# Patient Record
Sex: Male | Born: 1965 | Race: Black or African American | Hispanic: No | Smoking: Never smoker
Health system: Southern US, Community
[De-identification: ages and names within clinical notes are randomized; demographics above are authoritative.]

## PROBLEM LIST (undated history)

## (undated) DIAGNOSIS — E790 Hyperuricemia without signs of inflammatory arthritis and tophaceous disease: Secondary | ICD-10-CM

## (undated) DIAGNOSIS — K219 Gastro-esophageal reflux disease without esophagitis: Secondary | ICD-10-CM

## (undated) DIAGNOSIS — H3320 Serous retinal detachment, unspecified eye: Secondary | ICD-10-CM

## (undated) DIAGNOSIS — T7840XA Allergy, unspecified, initial encounter: Secondary | ICD-10-CM

## (undated) DIAGNOSIS — N529 Male erectile dysfunction, unspecified: Secondary | ICD-10-CM

## (undated) DIAGNOSIS — K469 Unspecified abdominal hernia without obstruction or gangrene: Secondary | ICD-10-CM

## (undated) DIAGNOSIS — E7800 Pure hypercholesterolemia, unspecified: Secondary | ICD-10-CM

## (undated) DIAGNOSIS — I10 Essential (primary) hypertension: Secondary | ICD-10-CM

## (undated) DIAGNOSIS — H409 Unspecified glaucoma: Secondary | ICD-10-CM

## (undated) DIAGNOSIS — I82409 Acute embolism and thrombosis of unspecified deep veins of unspecified lower extremity: Secondary | ICD-10-CM

## (undated) HISTORY — DX: Essential (primary) hypertension: I10

## (undated) HISTORY — DX: Allergy, unspecified, initial encounter: T78.40XA

## (undated) HISTORY — DX: Serous retinal detachment, unspecified eye: H33.20

## (undated) HISTORY — DX: Hyperuricemia without signs of inflammatory arthritis and tophaceous disease: E79.0

## (undated) HISTORY — PX: LACERATION REPAIR: SHX5168

## (undated) HISTORY — DX: Gastro-esophageal reflux disease without esophagitis: K21.9

## (undated) HISTORY — DX: Male erectile dysfunction, unspecified: N52.9

## (undated) HISTORY — PX: RETINAL DETACHMENT SURGERY: SHX105

---

## 2001-10-19 ENCOUNTER — Emergency Department (HOSPITAL_COMMUNITY): Admission: EM | Admit: 2001-10-19 | Discharge: 2001-10-19 | Payer: Self-pay | Admitting: Emergency Medicine

## 2001-10-19 ENCOUNTER — Encounter: Payer: Self-pay | Admitting: Emergency Medicine

## 2003-07-25 ENCOUNTER — Emergency Department (HOSPITAL_COMMUNITY): Admission: EM | Admit: 2003-07-25 | Discharge: 2003-07-25 | Payer: Self-pay | Admitting: Family Medicine

## 2003-08-19 ENCOUNTER — Emergency Department (HOSPITAL_COMMUNITY): Admission: EM | Admit: 2003-08-19 | Discharge: 2003-08-20 | Payer: Self-pay | Admitting: Emergency Medicine

## 2003-11-01 ENCOUNTER — Emergency Department (HOSPITAL_COMMUNITY): Admission: EM | Admit: 2003-11-01 | Discharge: 2003-11-01 | Payer: Self-pay | Admitting: Emergency Medicine

## 2004-02-26 ENCOUNTER — Emergency Department (HOSPITAL_COMMUNITY): Admission: EM | Admit: 2004-02-26 | Discharge: 2004-02-26 | Payer: Self-pay | Admitting: Emergency Medicine

## 2004-06-16 ENCOUNTER — Emergency Department (HOSPITAL_COMMUNITY): Admission: EM | Admit: 2004-06-16 | Discharge: 2004-06-16 | Payer: Self-pay | Admitting: Family Medicine

## 2004-08-20 ENCOUNTER — Emergency Department (HOSPITAL_COMMUNITY): Admission: EM | Admit: 2004-08-20 | Discharge: 2004-08-21 | Payer: Self-pay | Admitting: Emergency Medicine

## 2004-11-20 ENCOUNTER — Emergency Department (HOSPITAL_COMMUNITY): Admission: EM | Admit: 2004-11-20 | Discharge: 2004-11-20 | Payer: Self-pay | Admitting: Emergency Medicine

## 2005-02-25 ENCOUNTER — Emergency Department (HOSPITAL_COMMUNITY): Admission: EM | Admit: 2005-02-25 | Discharge: 2005-02-25 | Payer: Self-pay | Admitting: Emergency Medicine

## 2005-05-22 ENCOUNTER — Emergency Department (HOSPITAL_COMMUNITY): Admission: EM | Admit: 2005-05-22 | Discharge: 2005-05-23 | Payer: Self-pay | Admitting: Emergency Medicine

## 2005-07-13 ENCOUNTER — Ambulatory Visit (HOSPITAL_COMMUNITY): Admission: RE | Admit: 2005-07-13 | Discharge: 2005-07-14 | Payer: Self-pay | Admitting: Ophthalmology

## 2005-12-06 ENCOUNTER — Inpatient Hospital Stay (HOSPITAL_COMMUNITY): Admission: EM | Admit: 2005-12-06 | Discharge: 2005-12-07 | Payer: Self-pay

## 2007-03-18 ENCOUNTER — Emergency Department (HOSPITAL_COMMUNITY): Admission: EM | Admit: 2007-03-18 | Discharge: 2007-03-18 | Payer: Self-pay | Admitting: Emergency Medicine

## 2007-04-21 ENCOUNTER — Emergency Department (HOSPITAL_COMMUNITY): Admission: EM | Admit: 2007-04-21 | Discharge: 2007-04-21 | Payer: Self-pay | Admitting: Family Medicine

## 2007-08-18 ENCOUNTER — Emergency Department (HOSPITAL_COMMUNITY): Admission: EM | Admit: 2007-08-18 | Discharge: 2007-08-18 | Payer: Self-pay | Admitting: Emergency Medicine
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
## 2010-10-01 NOTE — Op Note (Signed)
NAMENIKLAS, CHRETIEN          ACCOUNT NO.:  [id number]   MEDICAL RECORD NO.:  [id number]          PATIENT TYPE:  OIB   LOCATION:  5705                         FACILITY:  [facility]   PHYSICIAN:  Alford Highland. Rankin, [name]   DATE OF BIRTH:  07-30-1965   DATE OF PROCEDURE:  07/13/2005  DATE OF DISCHARGE:                                 OPERATIVE REPORT   PREOPERATIVE DIAGNOSIS:  1.  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  2.  History of congenital cataracts, left eye.   POSTOPERATIVE DIAGNOSIS:  1.  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  2.  History of congenital cataracts, left eye.   PROCEDURE:  1.  Posterior vitrectomy with membrane peel, left eye.  2.  Endolaser panphotocoagulation, - focal left eye.  3.  Scleral buckle, left eye.  4.  Injection of vitreous substitute - C3F8 10%.  5.  Injection of vitreous substitute - temporary Perflouron.   SURGEON:  Alford Highland. Rankin, [name]   ANESTHESIA:  General endotracheal anesthesia.   INDICATIONS FOR PROCEDURE:  The patient is a 45 year old man who has a  history of congenital cataracts, congenital nystagmus, and within the last  five days, sudden  painless vision loss of the left eye.  He was found to  have rhegmatogenous retinal detachment of a highly bullous nature on the  office visit today.  The macula was found to be shallow detachment with  ultrasound left eye.  The patient understands this is an attempt to reattach  the retina.  He understands the risks of anesthesia including the rare  occurrence of death and also to the eye including but not limited to  hemorrhage, infection, scarring, need for further surgery, no change in  vision, loss of vision, progression of disease despite intervention.  After  appropriate signed consent was obtained, the patient was taken to the  operating room.   DESCRIPTION OF PROCEDURE:  In the operating room, appropriate monitoring was  followed by mild sedation.   General endotracheal anesthesia was instituted  without difficulty.  The left periocular region was sterilely prepped and  draped in the usual ophthalmic fashion.  A lid speculum was applied.  A  conjunctival peritomy was then fashioned 360 degrees and relaxing incision  was made inferiorly and superonasally.  The rectus muscles were isolated on  2-0 silk ties.  A 240 silicone implant was selected placing each of the  rectus muscles and secured in the superotemporal quadrant.  Retinal cryopexy  was applied to the bed of the buckle 360 degrees.  At this time, the  infusion was secured in the inferotemporal quadrant.  Placement in the  vitreous cavity was verified visually.  The superior sclerotomy was then  placed.  The microscope was placed into position with a BIOM attachment.  Core vitrectomy was then begun.  Notable findings were a retinal tear noted  at the 9 o'clock position as well as at the 9:30 positions.  These were  marked with internal diathermy for later external drainage of subretinal  fluid.  Attempts were made not to place a posterior retinotomy.  The  posterior retina was stabilized with Perflouron after a fluid-fluid exchange  had removed most of the thick subretinal fluid.  At this time, fluid air  exchange was carried out over the Perflouron.  Endolaser photocoagulation  was placed in retinopexy fashion 360 degrees as well as on the slope of the  buckle.  The Perflouron was removed and no reaccumulation of subretinal  fluid was noted.  Endolaser photocoagulation was placed posterior to the  buckle 360 degrees.  Excellent laser reaction was noted 360 degrees.  At  this time, Perflouron was evacuated from the eye under complete air, the  retina remained nicely attached.  Retinal cryopexy was confirmed to have  treated the retinal breaks previously  noted.  The band was appropriately  tightened.  The superior sclerotomy was closed and an air-C3F8 10% exchange  completed.   Conjunctiva and Tenon's were then brought forward and closed  with interrupted 7-0 Vicryl suture.  The bed of the buckle was irrigated  with bug juice.  Subconjunctival injection of antibiotic and steroid were  applied.  Notable findings were of normal intraocular pressure.  A sterile  patch and Fox shield were applied.  The patient was awakened from anesthesia  and taken to the recovery room in good, stable condition.      Alford Highland Rankin, [name]  Electronically Signed     GAR/MEDQ  D:  07/13/2005  T:  07/13/2005  Job:  [id number]   cc:   Delon Sacramento, [name]  Fax: [phone]
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
## 2011-03-28 ENCOUNTER — Encounter (INDEPENDENT_AMBULATORY_CARE_PROVIDER_SITE_OTHER): Payer: BC Managed Care – PPO | Admitting: Ophthalmology

## 2011-03-28 ENCOUNTER — Encounter (INDEPENDENT_AMBULATORY_CARE_PROVIDER_SITE_OTHER): Payer: Self-pay | Admitting: Ophthalmology

## 2011-03-28 DIAGNOSIS — H43819 Vitreous degeneration, unspecified eye: Secondary | ICD-10-CM

## 2011-03-28 DIAGNOSIS — H2700 Aphakia, unspecified eye: Secondary | ICD-10-CM

## 2011-03-28 DIAGNOSIS — H53009 Unspecified amblyopia, unspecified eye: Secondary | ICD-10-CM

## 2011-03-28 DIAGNOSIS — H33009 Unspecified retinal detachment with retinal break, unspecified eye: Secondary | ICD-10-CM

## 2011-10-26 ENCOUNTER — Other Ambulatory Visit: Payer: Self-pay | Admitting: Occupational Medicine

## 2011-10-26 ENCOUNTER — Ambulatory Visit: Payer: Self-pay

## 2011-10-26 DIAGNOSIS — M25539 Pain in unspecified wrist: Secondary | ICD-10-CM

## 2012-12-07 ENCOUNTER — Ambulatory Visit: Payer: BC Managed Care – PPO

## 2015-05-29 ENCOUNTER — Emergency Department (HOSPITAL_COMMUNITY)
Admission: EM | Admit: 2015-05-29 | Discharge: 2015-05-29 | Disposition: A | Discharge status: Home / Self Care | Payer: BLUE CROSS/BLUE SHIELD | Source: Home / Self Care | Attending: Family Medicine | Admitting: Family Medicine

## 2015-05-29 ENCOUNTER — Encounter (HOSPITAL_COMMUNITY): Payer: Self-pay | Admitting: *Deleted

## 2015-05-29 DIAGNOSIS — J42 Unspecified chronic bronchitis: Secondary | ICD-10-CM

## 2015-05-29 DIAGNOSIS — J209 Acute bronchitis, unspecified: Secondary | ICD-10-CM | POA: Diagnosis not present

## 2015-05-29 MED ORDER — DOXYCYCLINE HYCLATE 100 MG PO CAPS: 100.0000 mg | ORAL_CAPSULE | Freq: Two times a day (BID) | ORAL | Status: DC

## 2015-05-29 MED ORDER — GUAIFENESIN-CODEINE 100-10 MG/5ML PO SYRP: 10.0000 mL | ORAL_SOLUTION | Freq: Four times a day (QID) | ORAL | Status: DC | PRN

## 2015-05-29 NOTE — ED Provider Notes (Signed)
CSN: [id number]     Arrival date & time 05/29/15  1742 History   First MD Initiated Contact with Patient 05/29/15 1901     Chief Complaint  Patient presents with  . Cough   (Consider location/radiation/quality/duration/timing/severity/associated sxs/prior Treatment) Patient is a 50 y.o. male presenting with cough. The history is provided by the patient.  Cough Cough characteristics:  Productive Sputum characteristics:  Yellow and green Severity:  Moderate Onset quality:  Gradual Duration:  6 weeks Progression:  Worsening Chronicity:  New Smoker: no (pt's wife does smoke heavily.)   Context: smoke exposure   Associated symptoms: no fever     History reviewed. No pertinent past medical history. History reviewed. No pertinent past surgical history. History reviewed. No pertinent family history. Social History  Substance Use Topics  . Smoking status: None  . Smokeless tobacco: None  . Alcohol Use: No    Review of Systems  Constitutional: Negative for fever.  HENT: Positive for congestion and postnasal drip.   Respiratory: Positive for cough. Negative for choking.   Cardiovascular: Negative.   Genitourinary: Negative.   All other systems reviewed and are negative.   Allergies  Review of patient's allergies indicates no known allergies.  Home Medications   Prior to Admission medications   Medication Sig Start Date End Date Taking? Authorizing Provider  Dextromethorphan-Guaifenesin [facility](MUCINEX DM PO) Take by mouth.   Yes Historical Provider, MD  doxycycline (VIBRAMYCIN) 100 MG capsule Take 1 capsule (100 mg total) by mouth 2 (two) times daily. 05/29/15   Linna HoffJames D Felisa Zechman, MD  guaiFENesin-codeine [facility](ROBITUSSIN AC) 100-10 MG/5ML syrup Take 10 mLs by mouth 4 (four) times daily as needed for cough. 05/29/15   Linna HoffJames D Ryaan Vanwagoner, MD   Meds Ordered and Administered this Visit  Medications - No data to display  BP 131/82 mmHg  Pulse 79  Temp(Src) 98.2 F (36.8 C) (Oral)  Resp 16  SpO2  98% No data found.   Physical Exam  Constitutional: He is oriented to person, place, and time. He appears well-developed and well-nourished. No distress.  HENT:  Right Ear: External ear normal.  Left Ear: External ear normal.  Mouth/Throat: Oropharynx is clear and moist.  Eyes: Pupils are equal, round, and reactive to light.  Neck: Normal range of motion. Neck supple.  Cardiovascular: Normal rate, normal heart sounds and intact distal pulses.   Pulmonary/Chest: Effort normal. He has no decreased breath sounds. He has no wheezes. He has rhonchi. He has no rales.  Lymphadenopathy:    He has no cervical adenopathy.  Neurological: He is alert and oriented to person, place, and time.  Skin: Skin is warm and dry.  Nursing note and vitals reviewed.   ED Course  Procedures (including critical care time)  Labs Review Labs Reviewed - No data to display  Imaging Review No results found.   Visual Acuity Review  Right Eye Distance:   Left Eye Distance:   Bilateral Distance:    Right Eye Near:   Left Eye Near:    Bilateral Near:         MDM   1. Chronic bronchitis with acute exacerbation (HCC)       Linna HoffJames D Marysue Fait, MD 05/29/15 1943

## 2015-05-29 NOTE — ED Notes (Signed)
Pt  Reports      Symptoms  Of       Cough      X  6  Weeks    With     Mucous       Production   -  Symptoms    Not  releived  By  otc           meds   He  Is sitting  Upright   Speaking in  Complete  sentances

## 2015-07-29 ENCOUNTER — Encounter (HOSPITAL_COMMUNITY): Payer: Self-pay | Admitting: *Deleted

## 2015-07-29 ENCOUNTER — Emergency Department (HOSPITAL_COMMUNITY)
Admission: EM | Admit: 2015-07-29 | Discharge: 2015-07-29 | Disposition: A | Discharge status: Home / Self Care | Payer: BLUE CROSS/BLUE SHIELD | Attending: Emergency Medicine | Admitting: Emergency Medicine

## 2015-07-29 ENCOUNTER — Emergency Department (HOSPITAL_COMMUNITY): Payer: BLUE CROSS/BLUE SHIELD

## 2015-07-29 DIAGNOSIS — Z8669 Personal history of other diseases of the nervous system and sense organs: Secondary | ICD-10-CM | POA: Insufficient documentation

## 2015-07-29 DIAGNOSIS — R070 Pain in throat: Secondary | ICD-10-CM | POA: Diagnosis not present

## 2015-07-29 DIAGNOSIS — R5383 Other fatigue: Secondary | ICD-10-CM | POA: Insufficient documentation

## 2015-07-29 DIAGNOSIS — Z792 Long term (current) use of antibiotics: Secondary | ICD-10-CM | POA: Diagnosis not present

## 2015-07-29 DIAGNOSIS — Z79899 Other long term (current) drug therapy: Secondary | ICD-10-CM | POA: Diagnosis not present

## 2015-07-29 DIAGNOSIS — R079 Chest pain, unspecified: Secondary | ICD-10-CM | POA: Diagnosis not present

## 2015-07-29 HISTORY — DX: Unspecified glaucoma: H40.9

## 2015-07-29 LAB — I-STAT TROPONIN, ED: Troponin i, poc: 0 ng/mL (ref 0.00–0.08)

## 2015-07-29 LAB — CBC WITH DIFFERENTIAL/PLATELET
BASOS ABS: 0 10*3/uL (ref 0.0–0.1)
Basophils Relative: 1 %
EOS PCT: 5 %
Eosinophils Absolute: 0.5 10*3/uL (ref 0.0–0.7)
HCT: 39.5 % (ref 39.0–52.0)
Hemoglobin: 12.8 g/dL — ABNORMAL LOW (ref 13.0–17.0)
LYMPHS ABS: 2.7 10*3/uL (ref 0.7–4.0)
LYMPHS PCT: 32 %
MCH: 28.1 pg (ref 26.0–34.0)
MCHC: 32.4 g/dL (ref 30.0–36.0)
MCV: 86.8 fL (ref 78.0–100.0)
MONO ABS: 0.6 10*3/uL (ref 0.1–1.0)
Monocytes Relative: 7 %
Neutro Abs: 4.7 10*3/uL (ref 1.7–7.7)
Neutrophils Relative %: 55 %
PLATELETS: 386 10*3/uL (ref 150–400)
RBC: 4.55 MIL/uL (ref 4.22–5.81)
RDW: 12.4 % (ref 11.5–15.5)
WBC: 8.5 10*3/uL (ref 4.0–10.5)

## 2015-07-29 LAB — BASIC METABOLIC PANEL
Anion gap: 13 (ref 5–15)
BUN: 5 mg/dL — AB (ref 6–20)
CALCIUM: 9.8 mg/dL (ref 8.9–10.3)
CO2: 25 mmol/L (ref 22–32)
CREATININE: 1.27 mg/dL — AB (ref 0.61–1.24)
Chloride: 103 mmol/L (ref 101–111)
GFR calc Af Amer: >60 mL/min (ref >60)
GLUCOSE: 115 mg/dL — AB (ref 65–99)
Potassium: 4.2 mmol/L (ref 3.5–5.1)
Sodium: 141 mmol/L (ref 135–145)

## 2015-07-29 MED ORDER — SODIUM CHLORIDE 0.9 % IV BOLUS (SEPSIS)
1000.0000 mL | Freq: Once | INTRAVENOUS | Status: AC
  Administered 2015-07-29: 1000 mL via INTRAVENOUS

## 2015-07-29 MED ORDER — PANTOPRAZOLE SODIUM 40 MG IV SOLR
40.0000 mg | Freq: Once | INTRAVENOUS | Status: AC
  Administered 2015-07-29: 40 mg via INTRAVENOUS
  Filled 2015-07-29: qty 40

## 2015-07-29 MED ORDER — OMEPRAZOLE 20 MG PO CPDR: 20.0000 mg | DELAYED_RELEASE_CAPSULE | Freq: Every day | ORAL | Status: DC

## 2015-07-29 MED ORDER — GI COCKTAIL ~~LOC~~
30.0000 mL | Freq: Once | ORAL | Status: AC
  Administered 2015-07-29: 30 mL via ORAL
  Filled 2015-07-29: qty 30

## 2015-07-29 NOTE — ED Notes (Signed)
Pt presents via [facility] from home c/o burning sensation in chest, nonradiating.  Pt also reports belching and burning.  Burning radiates to throat.   Pt also reports generalized fatigue.  Denies burning sensation on arrival.  Denies N/V/diaphoresis.  182/1002 P-68 NSR, EKG unremarkable.  R-14 O2-99% RA.  Pt reports he thinks it is heartburn but "they" (family) thinks it is something different.  Pt a x 4, NAD.

## 2015-07-29 NOTE — Discharge Instructions (Signed)
You need to have your kidney function rechecked in 1-2 weeks.  Drink more water.  Food Choices for Gastroesophageal Reflux Disease, Adult When you have gastroesophageal reflux disease (GERD), the foods you eat and your eating habits are very important. Choosing the right foods can help ease the discomfort of GERD. WHAT GENERAL GUIDELINES DO I NEED TO FOLLOW?  Choose fruits, vegetables, whole grains, low-fat dairy products, and low-fat meat, fish, and poultry.  Limit fats such as oils, salad dressings, butter, nuts, and avocado.  Keep a food diary to identify foods that cause symptoms.  Avoid foods that cause reflux. These may be different for different people.  Eat frequent small meals instead of three large meals each day.  Eat your meals slowly, in a relaxed setting.  Limit fried foods.  Cook foods using methods other than frying.  Avoid drinking alcohol.  Avoid drinking large amounts of liquids with your meals.  Avoid bending over or lying down until 2-3 hours after eating. WHAT FOODS ARE NOT RECOMMENDED? The following are some foods and drinks that may worsen your symptoms: Vegetables Tomatoes. Tomato juice. Tomato and spaghetti sauce. Chili peppers. Onion and garlic. Horseradish. Fruits Oranges, grapefruit, and lemon (fruit and juice). Meats High-fat meats, fish, and poultry. This includes hot dogs, ribs, ham, sausage, salami, and bacon. Dairy Whole milk and chocolate milk. Sour cream. Cream. Butter. Ice cream. Cream cheese.  Beverages Coffee and tea, with or without caffeine. Carbonated beverages or energy drinks. Condiments Hot sauce. Barbecue sauce.  Sweets/Desserts Chocolate and cocoa. Donuts. Peppermint and spearmint. Fats and Oils High-fat foods, including [place] fries and potato chips. Other Vinegar. Strong spices, such as black pepper, white pepper, red pepper, cayenne, curry powder, cloves, ginger, and chili powder. The items listed above may not be a  complete list of foods and beverages to avoid. Contact your dietitian for more information.   This information is not intended to replace advice given to you by your health care provider. Make sure you discuss any questions you have with your health care provider.   Document Released: 05/02/2005 Document Revised: 05/23/2014 Document Reviewed: 03/06/2013 Elsevier Interactive Patient Education 2016 Elsevier Inc.  Peptic Ulcer A peptic ulcer is a sore in the lining of your esophagus (esophageal ulcer), stomach (gastric ulcer), or in the first part of your small intestine (duodenal ulcer). The ulcer causes erosion into the deeper tissue. CAUSES  Normally, the lining of the stomach and the small intestine protects itself from the acid that digests food. The protective lining can be damaged by:  An infection caused by a bacterium called Helicobacter pylori (H. pylori).  Regular use of nonsteroidal anti-inflammatory drugs (NSAIDs), such as ibuprofen or aspirin.  Smoking tobacco. Other risk factors include being older than 50, drinking alcohol excessively, and having a family history of ulcer disease.  SYMPTOMS   Burning pain or gnawing in the area between the chest and the belly button.  Heartburn.  Nausea and vomiting.  Bloating. The pain can be worse on an empty stomach and at night. If the ulcer results in bleeding, it can cause:  Black, tarry stools.  Vomiting of bright red blood.  Vomiting of coffee-ground-looking materials. DIAGNOSIS  A diagnosis is usually made based upon your history and an exam. Other tests and procedures may be performed to find the cause of the ulcer. Finding a cause will help determine the best treatment. Tests and procedures may include:  Blood tests, stool tests, or breath tests to check for the bacterium  H. pylori.  An upper gastrointestinal (GI) series of the esophagus, stomach, and small intestine.  An endoscopy to examine the esophagus, stomach,  and small intestine.  A biopsy. TREATMENT  Treatment may include:  Eliminating the cause of the ulcer, such as smoking, NSAIDs, or alcohol.  Medicines to reduce the amount of acid in your digestive tract.  Antibiotic medicines if the ulcer is caused by the H. pylori bacterium.  An upper endoscopy to treat a bleeding ulcer.  Surgery if the bleeding is severe or if the ulcer created a hole somewhere in the digestive system. HOME CARE INSTRUCTIONS   Avoid tobacco, alcohol, and caffeine. Smoking can increase the acid in the stomach, and continued smoking will impair the healing of ulcers.  Avoid foods and drinks that seem to cause discomfort or aggravate your ulcer.  Only take medicines as directed by your caregiver. Do not substitute over-the-counter medicines for prescription medicines without talking to your caregiver.  Keep any follow-up appointments and tests as directed. SEEK MEDICAL CARE IF:   Your do not improve within 7 days of starting treatment.  You have ongoing indigestion or heartburn. SEEK IMMEDIATE MEDICAL CARE IF:   You have sudden, sharp, or persistent abdominal pain.  You have bloody or dark black, tarry stools.  You vomit blood or vomit that looks like coffee grounds.  You become light-headed, weak, or feel faint.  You become sweaty or clammy. MAKE SURE YOU:   Understand these instructions.  Will watch your condition.  Will get help right away if you are not doing well or get worse.   This information is not intended to replace advice given to you by your health care provider. Make sure you discuss any questions you have with your health care provider.   Document Released: 04/29/2000 Document Revised: 05/23/2014 Document Reviewed: 11/30/2011 Elsevier Interactive Patient Education [employer]2016 Else[employer] Inc.

## 2015-07-29 NOTE — ED Provider Notes (Signed)
CSN: [id number]     Arrival date & time 07/29/15  1848 History   First MD Initiated Contact with Patient 07/29/15 1850     Chief Complaint  Patient presents with  . Chest Pain  . Fatigue     (Consider location/radiation/quality/duration/timing/severity/associated sxs/prior Treatment) HPI Comments: Patient presents emergency department with chief complaint of chest pain. He states that he has had a burning sensation in his chest and throat for the past several weeks. He states that it is intermittent, but worsened when he eats. He states that he also felt fatigued last night, and reported this to his family. Family members encouraged him to come to the emergency department for evaluation. He associates his fatigue to working too much. He denies any radiating pain. Denies any exertional symptoms. Denies any associated shortness breath, nausea, diaphoresis, or vomiting. Patient denies any cardiac/PE history. He has tried taking tagamet with no relief.    The history is provided by the patient. No language interpreter was used.    Past Medical History  Diagnosis Date  . Glaucoma    History reviewed. No pertinent past surgical history. No family history on file. Social History  Substance Use Topics  . Smoking status: Never Smoker   . Smokeless tobacco: None  . Alcohol Use: No    Review of Systems  Constitutional: Negative for fever and chills.  Respiratory: Negative for shortness of breath.   Cardiovascular: Positive for chest pain.  Gastrointestinal: Negative for nausea, vomiting, diarrhea and constipation.  Genitourinary: Negative for dysuria.  All other systems reviewed and are negative.     Allergies  Review of patient's allergies indicates no known allergies.  Home Medications   Prior to Admission medications   Medication Sig Start Date End Date Taking? Authorizing Provider  Dextromethorphan-Guaifenesin [facility] DM PO) Take by mouth.    Historical Provider, MD   doxycycline (VIBRAMYCIN) 100 MG capsule Take 1 capsule (100 mg total) by mouth 2 (two) times daily. 05/29/15   Linna Hoff, MD  guaiFENesin-codeine [facility]) 100-10 MG/5ML syrup Take 10 mLs by mouth 4 (four) times daily as needed for cough. 05/29/15   Linna Hoff, MD   BP 154/90 mmHg  Pulse 68  Temp(Src) 98.4 F (36.9 C) (Oral)  Resp 18  SpO2 99% Physical Exam  Constitutional: He is oriented to person, place, and time. He appears well-developed and well-nourished.  HENT:  Head: Normocephalic and atraumatic.  Eyes: Conjunctivae and EOM are normal. Pupils are equal, round, and reactive to light. Right eye exhibits no discharge. Left eye exhibits no discharge. No scleral icterus.  Neck: Normal range of motion. Neck supple. No JVD present.  Cardiovascular: Normal rate, regular rhythm and normal heart sounds.  Exam reveals no gallop and no friction rub.   No murmur heard. Pulmonary/Chest: Effort normal and breath sounds normal. No respiratory distress. He has no wheezes. He has no rales. He exhibits no tenderness.  Abdominal: Soft. He exhibits no distension and no mass. There is no tenderness. There is no rebound and no guarding.  No focal abdominal tenderness, no RLQ tenderness or pain at McBurney's point, no RUQ tenderness or Murphy's sign, no left-sided abdominal tenderness, no fluid wave, or signs of peritonitis   Musculoskeletal: Normal range of motion. He exhibits no edema or tenderness.  Neurological: He is alert and oriented to person, place, and time.  Skin: Skin is warm and dry.  Psychiatric: He has a normal mood and affect. His behavior is normal. Judgment and  thought content normal.  Nursing note and vitals reviewed.   ED Course  Procedures (including critical care time) Labs Review Labs Reviewed  CBC WITH DIFFERENTIAL/PLATELET - Abnormal; Notable for the following:    Hemoglobin 12.8 (*)    All other components within normal limits  BASIC METABOLIC PANEL  Rosezena SensorI-STAT  TROPOININ, ED    Imaging Review Dg Chest Port 1 View  07/29/2015  CLINICAL DATA:  Indigestion since last night, weakness. EXAM: PORTABLE CHEST 1 VIEW COMPARISON:  Chest x-ray dated 12/06/2005. FINDINGS: Borderline cardiomegaly, stable. Overall cardiomediastinal silhouette is stable in size and configuration. Lungs are clear. Eventration/ elevation of the right hemidiaphragm is grossly stable osseous and soft tissue structures about the chest are otherwise unremarkable. IMPRESSION: Lungs are clear and there is no evidence of acute cardiopulmonary abnormality. Borderline cardiomegaly, stable. Electronically Signed   By: Bary RichardStan  Maynard [name]   On: 07/29/2015 19:36   I have personally reviewed and evaluated these images and lab results as part of my medical decision-making.   EKG Interpretation   Date/Time:  Wednesday July 29 2015 19:00:04 EDT Ventricular Rate:  66 PR Interval:  159 QRS Duration: 82 QT Interval:  413 QTC Calculation: 433 R Axis:   19 Text Interpretation:  Sinus rhythm Low voltage, precordial leads Probable  anteroseptal infarct, old Confirmed by [facility] Center[facility]ESNER MD, Barbara CowerJASON [phone]([id number]) on  07/29/2015 7:12:05 PM      MDM   Final diagnoses:  Chest pain, unspecified chest pain type   Patient with chest pain/epigastric pain. Symptoms sound consistent with acid reflux/PUD. Family members are concerned, and encouraged patient to come to the emergency department to ensure that he is not having heart attack. His symptoms do not sound ACS related, he describes pain as burning, which is associated with eating. Additionally, he denies any shortness breath, exertional symptoms, nausea, or diaphoresis. Will check EKG and troponin. Will also give GI cocktail.  8:48 PM Patient reassessed, states that he is feeling improved. No burning sensation in his throat, but he still has some belching.  HEART score is 1.  PERC negative.  CXR is clear.  Doubt ACS/PE.  Plan for discharge with PCP follow-up.  Will  start patient on omeprazole.   Roxy HorsemanRobert Rune Mendez, PA-C 07/29/15 2251  Marily MemosJason Mesner, MD 07/29/15 [phone]

## 2015-08-19 ENCOUNTER — Ambulatory Visit (INDEPENDENT_AMBULATORY_CARE_PROVIDER_SITE_OTHER): Payer: BLUE CROSS/BLUE SHIELD | Admitting: Medical

## 2015-08-19 ENCOUNTER — Encounter: Payer: Self-pay | Admitting: Medical

## 2015-08-19 VITALS — BP 126/80 | HR 88 | Wt 218.0 lb

## 2015-08-19 DIAGNOSIS — R142 Eructation: Secondary | ICD-10-CM | POA: Diagnosis not present

## 2015-08-19 DIAGNOSIS — R9431 Abnormal electrocardiogram [ECG] [EKG]: Secondary | ICD-10-CM | POA: Diagnosis not present

## 2015-08-19 DIAGNOSIS — G478 Other sleep disorders: Secondary | ICD-10-CM

## 2015-08-19 DIAGNOSIS — K219 Gastro-esophageal reflux disease without esophagitis: Secondary | ICD-10-CM

## 2015-08-19 DIAGNOSIS — R0683 Snoring: Secondary | ICD-10-CM

## 2015-08-19 DIAGNOSIS — R0681 Apnea, not elsewhere classified: Secondary | ICD-10-CM | POA: Diagnosis not present

## 2015-08-19 DIAGNOSIS — R799 Abnormal finding of blood chemistry, unspecified: Secondary | ICD-10-CM

## 2015-08-19 DIAGNOSIS — E669 Obesity, unspecified: Secondary | ICD-10-CM

## 2015-08-19 LAB — CBC WITH DIFFERENTIAL/PLATELET
BASOS ABS: 59 {cells}/uL (ref 0–200)
Basophils Relative: 1 %
Eosinophils Absolute: 354 cells/uL (ref 15–500)
Eosinophils Relative: 6 %
HEMATOCRIT: 40.4 % (ref 38.5–50.0)
HEMOGLOBIN: 13.2 g/dL (ref 13.2–17.1)
Lymphocytes Relative: 31 %
Lymphs Abs: 1829 cells/uL (ref 850–3900)
MCH: 28 pg (ref 27.0–33.0)
MCHC: 32.7 g/dL (ref 32.0–36.0)
MCV: 85.6 fL (ref 80.0–100.0)
MPV: 10.3 fL (ref 7.5–12.5)
Monocytes Absolute: 590 cells/uL (ref 200–950)
Monocytes Relative: 10 %
NEUTROS ABS: 3068 {cells}/uL (ref 1500–7800)
NEUTROS PCT: 52 %
Platelets: 377 10*3/uL (ref 140–400)
RBC: 4.72 MIL/uL (ref 4.20–5.80)
RDW: 12.6 % (ref 11.0–15.0)
WBC: 5.9 10*3/uL (ref 4.0–10.5)

## 2015-08-19 MED ORDER — PANTOPRAZOLE SODIUM 40 MG PO TBEC: DELAYED_RELEASE_TABLET | ORAL | Status: DC

## 2015-08-19 NOTE — Progress Notes (Signed)
Subjective: Chief Complaint  Patient presents with  . New Patient (Initial Visit)    er follow up it was for heartburn. still has some acid reflux but has calmed down   Here as a new patient.  Was seeing urgent care prior.  Here for ED f/u. [facility] weeks ago.  Accompanied by wife.  He went to the ED for consistent belching, chest discomfort, cough.   Had CXR, EKG, labs, and there was mention of bronchitis.     Wife notes that the day he went to the ED, he had stumbled that morning per wife. He said he was just sleepy.  Wife says he almost fell going out the door.  Later that day was off balance.  Wife says he went straight to bed that night when he got home, which she says was unusual for him.  She notes he is always tired too.  After he was holding chest, belching, they called EMS.     Since the ED visit, the symptoms have improved.   Has had less belching.  He denies any more chest discomfort, stumbling.   No dizziness.   Denies nausea.  Taking Tagamet for GERD.  He does eat spicy foods.    Snores loud, wife says he quits breathing in his sleep.  Wife says he probably goes several seconds without breathing counted one time 30 seconds between breaths.   Wife wants to knock him awake at times.   Wife says he gets sleepy in the day, but he denies this.    Doesn't necessary say he feels rested in the morning.  Denies frequency headache.  Wife notes some prior elevated BPs.  No other aggravating or relieving factors. No other complaint.   Past Medical History  Diagnosis Date  . Glaucoma    ROS as in subjective   Objective: BP 126/80 mmHg  Pulse 88  Wt 218 lb (98.884 kg)  General appearance: alert, no distress, WD/WN, AA male HEENT: normocephalic, sclerae anicteric, TMs pearly, nares patent, no discharge or erythema, pharynx normal Oral cavity: MMM, no lesions Neck: supple, no lymphadenopathy, no thyromegaly, no masses Heart: RRR, normal S1, S2, no murmurs Lungs: CTA  bilaterally, no wheezes, rhonchi, or rales Abdomen: +bs, soft, non tender, non distended, no masses, no hepatomegaly, no splenomegaly Pulses: 2+ symmetric, upper and lower extremities, normal cap refill Ext: no edema      Assessment: Encounter Diagnoses  Name Primary?  . Gastroesophageal reflux disease without esophagitis Yes  . Belching   . Abnormal blood chemistry   . Obesity   . Non-restorative sleep   . Snoring   . Witnessed apneic spells   . Nonspecific abnormal electrocardiogram (ECG) (EKG)      Plan: GERD - add Protonix, c/t Tagamet, avoid triggers, f/u in 2-3 wk.  Consider GI referral Abnormal lab finding in march - Repeat labs today given abnormal labs at the ED visit in 07/29/15 obesity - counseled on losing weight Non restorative sleep, witnessed apnea, snoring, fatigue - referral for sleep study.   Abnormal EKG - reviewed 07/29/15 EKG and hospital notes, labs.  Discussed the symptoms, abnormal evaluation and will f/u with labs, sleep study.   epworth sleep score 12, neck circumference 16.75"  Spent > 30 minutes face to face with patient in discussion of symptoms, evaluation, plan and recommendations.    [name] [name] was seen today for new patient (initial visit).  Diagnoses and all orders for this visit:  Gastroesophageal reflux disease  without esophagitis -     Comprehensive metabolic panel -     Lipid panel -     CBC with Differential/Platelet -     Hemoglobin A1c -     Iron and TIBC  Belching -     Comprehensive metabolic panel -     Lipid panel -     CBC with Differential/Platelet -     Hemoglobin A1c -     Iron and TIBC  Abnormal blood chemistry -     Comprehensive metabolic panel -     Lipid panel -     CBC with Differential/Platelet -     Hemoglobin A1c -     Iron and TIBC  Obesity -     Comprehensive metabolic panel -     Lipid panel -     CBC with Differential/Platelet -     Hemoglobin A1c -     Iron and TIBC  Non-restorative sleep -      Comprehensive metabolic panel -     Lipid panel -     CBC with Differential/Platelet -     Hemoglobin A1c -     Iron and TIBC  Snoring -     Comprehensive metabolic panel -     Lipid panel -     CBC with Differential/Platelet -     Hemoglobin A1c -     Iron and TIBC  Witnessed apneic spells -     Comprehensive metabolic panel -     Lipid panel -     CBC with Differential/Platelet -     Hemoglobin A1c -     Iron and TIBC  Nonspecific abnormal electrocardiogram (ECG) (EKG) -     Comprehensive metabolic panel -     Lipid panel -     CBC with Differential/Platelet -     Hemoglobin A1c -     Iron and TIBC  Other orders -     pantoprazole (PROTONIX) 40 MG tablet; 1 tablet daily po 45 min prior to breakfast

## 2015-08-20 ENCOUNTER — Encounter: Payer: Self-pay | Admitting: Gastroenterology

## 2015-08-20 ENCOUNTER — Telehealth: Payer: Self-pay

## 2015-08-20 DIAGNOSIS — D649 Anemia, unspecified: Secondary | ICD-10-CM

## 2015-08-20 DIAGNOSIS — G478 Other sleep disorders: Secondary | ICD-10-CM

## 2015-08-20 DIAGNOSIS — E611 Iron deficiency: Secondary | ICD-10-CM

## 2015-08-20 LAB — LIPID PANEL
Cholesterol: 188 mg/dL (ref 125–200)
HDL: 43 mg/dL (ref >=40)
LDL CALC: 126 mg/dL (ref <130)
TRIGLYCERIDES: 97 mg/dL (ref <150)
Total CHOL/HDL Ratio: 4.4 Ratio (ref <=5.0)
VLDL: 19 mg/dL (ref <30)

## 2015-08-20 LAB — COMPREHENSIVE METABOLIC PANEL
ALBUMIN: 4.5 g/dL (ref 3.6–5.1)
ALT: 12 U/L (ref 9–46)
AST: 15 U/L (ref 10–40)
Alkaline Phosphatase: 40 U/L (ref 40–115)
BUN: 9 mg/dL (ref 7–25)
CHLORIDE: 102 mmol/L (ref 98–110)
CO2: 23 mmol/L (ref 20–31)
CREATININE: 0.99 mg/dL (ref 0.60–1.35)
Calcium: 10 mg/dL (ref 8.6–10.3)
GLUCOSE: 95 mg/dL (ref 65–99)
Potassium: 4.8 mmol/L (ref 3.5–5.3)
SODIUM: 138 mmol/L (ref 135–146)
Total Bilirubin: 0.9 mg/dL (ref 0.2–1.2)
Total Protein: 7.7 g/dL (ref 6.1–8.1)

## 2015-08-20 LAB — HEMOGLOBIN A1C
Hgb A1c MFr Bld: 5.7 % — ABNORMAL HIGH (ref <5.7)
MEAN PLASMA GLUCOSE: 117 mg/dL

## 2015-08-20 LAB — IRON AND TIBC
%SAT: 17 % (ref 15–60)
Iron: 48 ug/dL — ABNORMAL LOW (ref 50–180)
TIBC: 288 ug/dL (ref 250–425)
UIBC: 240 ug/dL (ref 125–400)

## 2015-08-20 NOTE — Telephone Encounter (Signed)
-----   Message from Jac Canavanavid S Tysinger, PA-C sent at 08/20/2015  5:14 AM EDT ----- Iron is a little on the low side and labs show at risk for diabetes.   I recommend he take some oral iron (Rx sent) daily for now.  Refer for sleep study as we discussed yesterday  Given the anemia/slightly low blood count and low iron, I do in fact want to refer to gastroenterology for further eval to rule out any worrisome things in the gastric system.  In other words, he will probably need endoscopy/colonoscopy.

## 2015-08-20 NOTE — Telephone Encounter (Signed)
Referral for GI and sleep study order

## 2015-08-21 ENCOUNTER — Other Ambulatory Visit: Payer: Self-pay | Admitting: Medical

## 2015-08-21 MED ORDER — FERROUS SULFATE 325 (65 FE) MG PO TABS: 325.0000 mg | ORAL_TABLET | Freq: Every day | ORAL | Status: DC

## 2015-10-19 ENCOUNTER — Ambulatory Visit: Payer: Self-pay | Admitting: Gastroenterology

## 2015-10-21 ENCOUNTER — Encounter (HOSPITAL_BASED_OUTPATIENT_CLINIC_OR_DEPARTMENT_OTHER): Payer: Self-pay

## 2015-12-02 ENCOUNTER — Ambulatory Visit (HOSPITAL_BASED_OUTPATIENT_CLINIC_OR_DEPARTMENT_OTHER): Payer: BLUE CROSS/BLUE SHIELD | Attending: Medical | Admitting: Internal Medicine

## 2015-12-02 VITALS — Ht 65.0 in | Wt 227.0 lb

## 2015-12-02 DIAGNOSIS — Z6838 Body mass index (BMI) 38.0-38.9, adult: Secondary | ICD-10-CM | POA: Diagnosis not present

## 2015-12-02 DIAGNOSIS — R51 Headache: Secondary | ICD-10-CM | POA: Diagnosis not present

## 2015-12-02 DIAGNOSIS — G4700 Insomnia, unspecified: Secondary | ICD-10-CM | POA: Diagnosis present

## 2015-12-02 DIAGNOSIS — E669 Obesity, unspecified: Secondary | ICD-10-CM | POA: Insufficient documentation

## 2015-12-02 DIAGNOSIS — G4719 Other hypersomnia: Secondary | ICD-10-CM | POA: Diagnosis not present

## 2015-12-02 DIAGNOSIS — R5383 Other fatigue: Secondary | ICD-10-CM | POA: Insufficient documentation

## 2015-12-02 DIAGNOSIS — R0683 Snoring: Secondary | ICD-10-CM | POA: Diagnosis not present

## 2015-12-02 DIAGNOSIS — G4733 Obstructive sleep apnea (adult) (pediatric): Secondary | ICD-10-CM | POA: Diagnosis not present

## 2015-12-02 DIAGNOSIS — G478 Other sleep disorders: Secondary | ICD-10-CM

## 2015-12-06 DIAGNOSIS — G4733 Obstructive sleep apnea (adult) (pediatric): Secondary | ICD-10-CM

## 2015-12-06 NOTE — Procedures (Signed)
   Patient Name: [name] [name], [name] [name] Date: 12/02/2015 Gender: Male D.O.B: 01/18/1966 Age (years): 50 Referring Provider: Crosby Oyster Height (inches): 65 Interpreting Physician: Jetty Duhamel MD, ABSM Weight (lbs): 227 RPSGT: Shelah Lewandowsky BMI: 38 MRN: [id number] Neck Size: 16.00 CLINICAL INFORMATION Sleep Study Type: NPSG Indication for sleep study: Excessive Daytime Sleepiness, Fatigue, Morning Headaches, Obesity, OSA, Snoring, Witnessed Apneas Epworth Sleepiness Score: 14  SLEEP STUDY TECHNIQUE As per the AASM Manual for the Scoring of Sleep and Associated Events v2.3 (April 2016) with a hypopnea requiring 4% desaturations. The channels recorded and monitored were frontal, central and occipital EEG, electrooculogram (EOG), submentalis EMG (chin), nasal and oral airflow, thoracic and abdominal wall motion, anterior tibialis EMG, snore microphone, electrocardiogram, and pulse oximetry.  MEDICATIONS Patient's medications include: charted for review. Medications self-administered by patient during sleep study : No sleep medicine administered.  SLEEP ARCHITECTURE The study was initiated at 9:35:13 PM and ended at 4:16:18 AM. Sleep onset time was 9.3 minutes and the sleep efficiency was 90.4%. The total sleep time was 362.7 minutes. Stage REM latency was 170.5 minutes. The patient spent 14.54% of the night in stage N1 sleep, 66.44% in stage N2 sleep, 0.00% in stage N3 and 19.02% in REM. Alpha intrusion was absent. Supine sleep was 68.71%.  RESPIRATORY PARAMETERS The overall apnea/hypopnea index (AHI) was 7.1 per hour. There were 0 total apneas, including 0 obstructive, 0 central and 0 mixed apneas. There were 43 hypopneas and 46 RERAs. The AHI during Stage REM sleep was 28.7 per hour. AHI while supine was 9.6 per hour. The mean oxygen saturation was 94.04%. The minimum SpO2 during sleep was 83.00%. Moderate snoring was noted during this study.  CARDIAC DATA The 2  lead EKG demonstrated sinus rhythm. The mean heart rate was 56.78 beats per minute. Other EKG findings include: None.  LEG MOVEMENT DATA The total PLMS were 7 with a resulting PLMS index of 1.16. Associated arousal with leg movement index was 0.0 .  IMPRESSIONS - Mild obstructive sleep apnea occurred during this study (AHI = 7.1/h). - No significant central sleep apnea occurred during this study (CAI = 0.0/h). - Mild oxygen desaturation was noted during this study (Min O2 = 83.00%). - The patient snored with Moderate snoring volume. - No cardiac abnormalities were noted during this study. - Clinically significant periodic limb movements did not occur during sleep. No significant associated arousals.  DIAGNOSIS - Obstructive Sleep Apnea (327.23 [G47.33 ICD-10])  RECOMMENDATIONS - Positional therapy avoiding supine position during sleep. - Very mild obstructive sleep apnea. Return to discuss treatment options. - Avoid alcohol, sedatives and other CNS depressants that may worsen sleep apnea and disrupt normal sleep architecture. - Sleep hygiene should be reviewed to assess factors that may improve sleep quality. - Weight management and regular exercise should be initiated or continued if appropriate.  [Electronically signed] 12/06/2015 05:16 PM  Jetty Duhamel MD, ABSM Diplomate, American Board of Sleep Medicine   NPI: [id number]  Waymon Budge Diplomate, American Board of Sleep Medicine  ELECTRONICALLY SIGNED ON:  12/06/2015, 5:15 PM [facility] PH: [phone]   FX: [phone] ACCREDITED BY THE AMERICAN ACADEMY OF SLEEP MEDICINE

## 2015-12-07 ENCOUNTER — Telehealth: Payer: Self-pay | Admitting: Medical

## 2015-12-07 NOTE — Telephone Encounter (Signed)
Lets get him back in for follow up from his first visit and sleep study report.

## 2015-12-07 NOTE — Telephone Encounter (Signed)
Pts phone is not accepting incoming calls

## 2015-12-09 NOTE — Telephone Encounter (Signed)
[name] [name] and [name] [name]

## 2015-12-15 ENCOUNTER — Encounter: Payer: Self-pay | Admitting: Gastroenterology

## 2015-12-15 ENCOUNTER — Ambulatory Visit (INDEPENDENT_AMBULATORY_CARE_PROVIDER_SITE_OTHER): Payer: BLUE CROSS/BLUE SHIELD | Admitting: Gastroenterology

## 2015-12-15 VITALS — BP 110/80 | HR 64 | Ht 65.0 in | Wt 223.6 lb

## 2015-12-15 DIAGNOSIS — D509 Iron deficiency anemia, unspecified: Secondary | ICD-10-CM

## 2015-12-15 MED ORDER — NA SULFATE-K SULFATE-MG SULF 17.5-3.13-1.6 GM/177ML PO SOLN: 1.0000 | Freq: Once | ORAL | 0 refills | Status: AC

## 2015-12-15 NOTE — Telephone Encounter (Signed)
Sent letter

## 2015-12-15 NOTE — Patient Instructions (Signed)
You will be set up for a colonoscopy for colon cancer screening. 

## 2015-12-15 NOTE — Progress Notes (Signed)
HPI: This is a   very pleasant 50 year old man who was referred to me by Jac Canavan, PA-C  to evaluate  iron deficiency .    Chief complaint is slight iron deficiency  No overt  Bleeding.  No bowel changes.  No colon cancer in his family.  No nausea, no vomiting, no abd pains.  Overall weight fluctuates.  He has belching at times. No dysphagia. No pyrosis.  Blood work April 2017: CBC showed hemoglobin 13.2 which was low normal, iron was 48 which is just under the lower limit of normal of 50. TIBC was normal, RDW is normal, MCV was normal, platelets are normal. Basic metabolic profile is also normal.   Review of systems: Pertinent positive and negative review of systems were noted in the above HPI section. Complete review of systems was performed and was otherwise normal.   Past Medical History:  Diagnosis Date  . Glaucoma   . Hypertension     Past Surgical History:  Procedure Laterality Date  . RETINAL DETACHMENT SURGERY     left    Current Outpatient Prescriptions  Medication Sig Dispense Refill  . brimonidine (ALPHAGAN) 0.15 % ophthalmic solution Place 1 drop into the right eye 2 (two) times daily.     . cimetidine (TAGAMET HB) 200 MG tablet Take 200 mg by mouth daily as needed (for heartburn).    . dorzolamide-timolol (COSOPT) 22.3-6.8 MG/ML ophthalmic solution Place 1 drop into both eyes 2 (two) times daily.    . ferrous sulfate 325 (65 FE) MG tablet Take 1 tablet (325 mg total) by mouth daily with breakfast. 30 tablet 2  . omeprazole (PRILOSEC) 20 MG capsule Take 1 capsule (20 mg total) by mouth daily. 30 capsule 0  . pantoprazole (PROTONIX) 40 MG tablet 1 tablet daily po 45 min prior to breakfast 30 tablet 2   No current facility-administered medications for this visit.     Allergies as of 12/15/2015  . (No Known Allergies)    Family History  Problem Relation Age of Onset  . Diabetes Mother   . Heart disease Mother 25  . Anemia Mother   .  Glaucoma Mother   . Alcohol abuse Father   . Cirrhosis Father   . Glaucoma Father   . Kidney disease Sister     stones  . Blindness Brother   . Diabetes Brother   . Stroke Neg Hx     Social History   Social History  . Marital status: Legally Separated    Spouse name: N/A  . Number of children: 2  . Years of education: N/A   Occupational History  . Not on file.   Social History Main Topics  . Smoking status: Never Smoker  . Smokeless tobacco: Never Used  . Alcohol use Yes     Comment: occasional  . Drug use: No  . Sexual activity: Not on file   Other Topics Concern  . Not on file   Social History Narrative   Married.  Exercise at gym at work.  Goes to [employer] some.   Works Teaching laboratory technician and receiving.      Physical Exam: BP 110/80 (BP Location: Left Arm, Patient Position: Sitting, Cuff Size: Large)   Pulse 64   Ht 5\' 5"  (1.651 m)   Wt 223 lb 9.6 oz (101.4 kg)   BMI 37.21 kg/m  Constitutional: generally well-appearing Psychiatric: alert and oriented x3 Eyes: extraocular movements intact Mouth: oral pharynx moist, no lesions Neck:  supple no lymphadenopathy Cardiovascular: heart regular rate and rhythm Lungs: clear to auscultation bilaterally Abdomen: soft, nontender, nondistended, no obvious ascites, no peritoneal signs, normal bowel sounds Extremities: no lower extremity edema bilaterally Skin: no lesions on visible extremities   Assessment and plan: 50 y.o. male with  Slight iron deficiency  His iron level was slightly low. His hemoglobin was also low end of normal. MCV was normal. He does not have significant iron deficiency anemia obviously.  He has no overt bleeding. No alarm symptoms related to his GI tract. We did discuss the colon cancer screening generally begins around the age of 91, many believe 22 is better age to start [facility] cancer screening for African-Americans and I recommended we go ahead and do that for him. I see no reason for any further  blood tests or imaging studies prior to then.   Rob Bunting, MD [facility] 12/15/2015, 9:02 AM  Cc: Jac Canavan, PA-C

## 2015-12-18 ENCOUNTER — Telehealth: Payer: Self-pay | Admitting: Medical

## 2015-12-18 NOTE — Telephone Encounter (Signed)
Pt called and I made him a follow up appt and to go over his sleep study report for sept 11th

## 2016-01-05 ENCOUNTER — Encounter: Payer: Self-pay | Admitting: Gastroenterology

## 2016-01-13 ENCOUNTER — Telehealth: Payer: Self-pay | Admitting: Gastroenterology

## 2016-01-13 MED ORDER — NA SULFATE-K SULFATE-MG SULF 17.5-3.13-1.6 GM/177ML PO SOLN: 1.0000 | Freq: Once | ORAL | 0 refills | Status: AC

## 2016-01-13 NOTE — Telephone Encounter (Signed)
Prescription was sent at the office visit, [facility] Pt aware

## 2016-01-19 ENCOUNTER — Encounter: Payer: Self-pay | Admitting: Gastroenterology

## 2016-01-25 ENCOUNTER — Ambulatory Visit: Payer: Self-pay | Admitting: Medical

## 2016-01-29 ENCOUNTER — Encounter: Payer: Self-pay | Admitting: Medical

## 2016-03-30 ENCOUNTER — Encounter: Payer: Self-pay | Admitting: Gastroenterology

## 2016-03-30 ENCOUNTER — Telehealth: Payer: Self-pay | Admitting: Gastroenterology

## 2016-03-30 ENCOUNTER — Encounter: Payer: BLUE CROSS/BLUE SHIELD | Admitting: Gastroenterology

## 2016-03-30 NOTE — Telephone Encounter (Signed)
Ok, please charge the no show fee

## 2016-12-29 IMAGING — DX DG CHEST 1V PORT
1 series · 1 of 1 positions shown · non-contrast
Comparison: Chest x-ray dated 12/06/2005.

CLINICAL DATA: Indigestion since last night, weakness.

EXAM:
PORTABLE CHEST 1 VIEW

[chest ap]
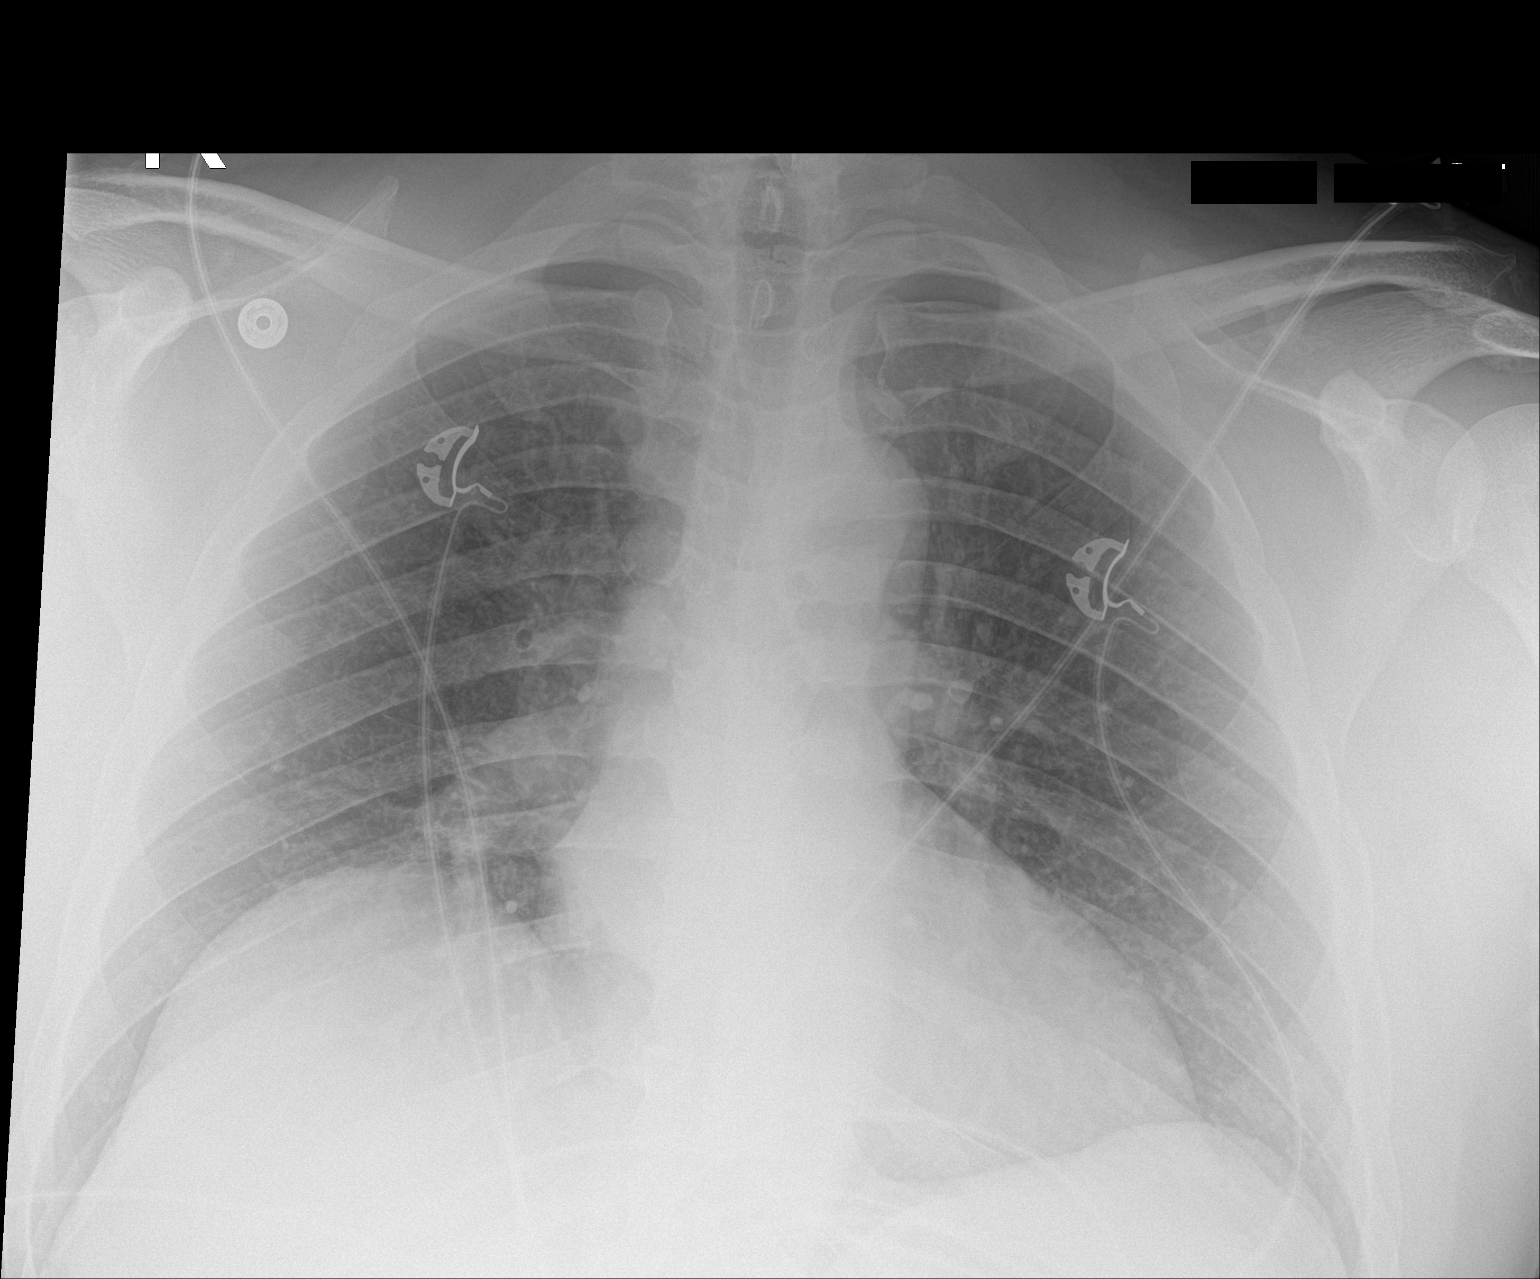

[1 of 1 positions shown; findings below may reference images not displayed]

FINDINGS: Borderline cardiomegaly, stable. Overall cardiomediastinal
silhouette is stable in size and configuration. Lungs are clear.

Eventration/ elevation of the right hemidiaphragm is grossly stable
osseous and soft tissue structures about the chest are otherwise
unremarkable.
IMPRESSION: Lungs are clear and there is no evidence of acute cardiopulmonary
abnormality.

Borderline cardiomegaly, stable.

## 2020-09-22 ENCOUNTER — Other Ambulatory Visit: Payer: Self-pay | Admitting: Family Medicine

## 2020-09-22 ENCOUNTER — Other Ambulatory Visit: Payer: Self-pay

## 2020-09-22 ENCOUNTER — Ambulatory Visit: Payer: Self-pay

## 2020-09-22 DIAGNOSIS — M79645 Pain in left finger(s): Secondary | ICD-10-CM

## 2020-10-31 ENCOUNTER — Encounter: Payer: Self-pay | Admitting: *Deleted

## 2020-10-31 ENCOUNTER — Other Ambulatory Visit: Payer: Self-pay

## 2020-10-31 ENCOUNTER — Ambulatory Visit
Admission: EM | Admit: 2020-10-31 | Discharge: 2020-10-31 | Disposition: A | Discharge status: Home / Self Care | Payer: BC Managed Care – PPO | Attending: Physician Assistant | Admitting: Physician Assistant

## 2020-10-31 ENCOUNTER — Telehealth: Payer: Self-pay | Admitting: Physician Assistant

## 2020-10-31 DIAGNOSIS — R220 Localized swelling, mass and lump, head: Secondary | ICD-10-CM | POA: Diagnosis not present

## 2020-10-31 DIAGNOSIS — K089 Disorder of teeth and supporting structures, unspecified: Secondary | ICD-10-CM

## 2020-10-31 MED ORDER — SULFAMETHOXAZOLE-TRIMETHOPRIM 800-160 MG PO TABS
1.0000 | ORAL_TABLET | Freq: Two times a day (BID) | ORAL | 0 refills | Status: AC
Start: 2020-10-31 — End: 2020-11-10

## 2020-10-31 NOTE — ED Provider Notes (Signed)
[facility]    CSN: [id number] Arrival date & time: 10/31/20  0848      History   Chief Complaint Chief Complaint  Patient presents with   Abscess    HPI [name] [name] is a 55 y.o. male.   Pt complains of left sided facial swelling and discomfort that started about 2 days ago. Denies fever, chills.  Report some left upper dental pain.  He has applied a cold compress with temporary relief.     Past Medical History:  Diagnosis Date   Glaucoma    Hypertension     Patient Active Problem List   Diagnosis Date Noted   Gastroesophageal reflux disease without esophagitis 08/19/2015   Belching 08/19/2015   Abnormal blood chemistry 08/19/2015   Obesity 08/19/2015   Snoring 08/19/2015   Labor abnormality 08/19/2015   Non-restorative sleep 08/19/2015   Nonspecific abnormal electrocardiogram (ECG) (EKG) 08/19/2015   Witnessed apneic spells 08/19/2015    Past Surgical History:  Procedure Laterality Date   RETINAL DETACHMENT SURGERY     left       Home Medications    Prior to Admission medications   Medication Sig Start Date End Date Taking? Authorizing Provider  brimonidine (ALPHAGAN) 0.15 % ophthalmic solution Place 1 drop into both eyes 2 (two) times daily.   Yes [provider]  dorzolamide-timolol (COSOPT) 22.3-6.8 MG/ML ophthalmic solution Place 1 drop into both eyes 2 (two) times daily.   Yes [provider]  sulfamethoxazole-trimethoprim (BACTRIM DS) 800-160 MG tablet Take 1 tablet by mouth 2 (two) times daily for 10 days. 10/31/20 11/10/20 Yes Annayah Worthley, Shanda Bumps, PA-C  cimetidine (TAGAMET) 200 MG tablet Take 200 mg by mouth daily as needed (for heartburn).    [provider]  ferrous sulfate 325 (65 FE) MG tablet Take 1 tablet (325 mg total) by mouth daily with breakfast. 08/21/15   Tysinger, Kermit Balo, PA-C  omeprazole (PRILOSEC) 20 MG capsule Take 1 capsule (20 mg total) by mouth daily. 07/29/15   Roxy Horseman, PA-C   pantoprazole (PROTONIX) 40 MG tablet 1 tablet daily po 45 min prior to breakfast 08/19/15   Tysinger, Kermit Balo, PA-C    Family History Family History  Problem Relation Age of Onset   Diabetes Mother    Heart disease Mother 44   Anemia Mother    Glaucoma Mother    Alcohol abuse Father    Cirrhosis Father    Glaucoma Father    Kidney disease Sister        stones   Blindness Brother    Diabetes Brother    Stroke Neg Hx     Social History Social History   Tobacco Use   Smoking status: Never   Smokeless tobacco: Never  Vaping Use   Vaping Use: Never used  Substance Use Topics   Alcohol use: Yes    Comment: occasional   Drug use: No     Allergies   Patient has no known allergies.   Review of Systems Review of Systems  Constitutional:  Negative for chills and fever.  HENT:  Positive for dental problem and facial swelling. Negative for ear pain and sore throat.   Eyes:  Negative for pain and visual disturbance.  Respiratory:  Negative for cough and shortness of breath.   Cardiovascular:  Negative for chest pain and palpitations.  Gastrointestinal:  Negative for abdominal pain and vomiting.  Genitourinary:  Negative for dysuria and hematuria.  Musculoskeletal:  Negative for arthralgias and back pain.  Skin:  Negative for color change and rash.  Neurological:  Negative for seizures and syncope.  All other systems reviewed and are negative.   Physical Exam Triage Vital Signs ED Triage Vitals [10/31/20 0911]  Enc Vitals Group     BP 133/89     Pulse Rate 70     Resp 18     Temp (!) 97.3 F (36.3 C)     Temp Source Temporal     SpO2 95 %     Weight      Height      Head Circumference      Peak Flow      Pain Score 5     Pain Loc      Pain Edu?      Excl. in GC?    No data found.  Updated Vital Signs BP 133/89   Pulse 70   Temp (!) 97.3 F (36.3 C) (Temporal)   Resp 18   SpO2 95%   Visual Acuity Right Eye Distance:   Left Eye Distance:    Bilateral Distance:    Right Eye Near:   Left Eye Near:    Bilateral Near:     Physical Exam Vitals and nursing note reviewed.  Constitutional:      Appearance: He is well-developed.  HENT:     Head: Normocephalic and atraumatic.      Mouth/Throat:     Dentition: Abnormal dentition. Dental caries present. No dental tenderness or gum lesions.     Comments: Multiple broken left upper teeth, no abscess noted.  Eyes:     Conjunctiva/sclera: Conjunctivae normal.  Cardiovascular:     Rate and Rhythm: Normal rate and regular rhythm.     Heart sounds: No murmur heard. Pulmonary:     Effort: Pulmonary effort is normal. No respiratory distress.     Breath sounds: Normal breath sounds.  Abdominal:     Palpations: Abdomen is soft.     Tenderness: There is no abdominal tenderness.  Musculoskeletal:     Cervical back: Neck supple.  Skin:    General: Skin is warm and dry.  Neurological:     Mental Status: He is alert.     UC Treatments / Results  Labs (all labs ordered are listed, but only abnormal results are displayed) Labs Reviewed - No data to display  EKG   Radiology No results found.  Procedures Procedures (including critical care time)  Medications Ordered in UC Medications - No data to display  Initial Impression / Assessment and Plan / UC Course  I have reviewed the triage vital signs and the nursing notes.  Pertinent labs & imaging results that were available during my care of the patient were reviewed by me and considered in my medical decision making (see chart for details).     Left sided facial swelling, pain; left upper broken tooth.  Will start antiobitic to cover for dental abscess/facial abscess.  Pt well appearing, vitals normal.  Advised close monitoring and to follow up in the ED for further evaluation if sx worsen or do not improve after starting antibiotic.  Final Clinical Impressions(s) / UC Diagnoses   Final diagnoses:  Left facial swelling   Poor dentition     Discharge Instructions      Take antibiotic as prescribed Apply warm compress to affected area Return to evaluation if swelling worsens or you develop fever/chills  Follow up with dentist as soon as possible   ED Prescriptions  Medication Sig Dispense Auth. Provider   sulfamethoxazole-trimethoprim (BACTRIM DS) 800-160 MG tablet Take 1 tablet by mouth 2 (two) times daily for 10 days. 14 tablet Jodell Cipro, PA-C      PDMP not reviewed this encounter.   Jodell Cipro, PA-C 10/31/20 1011

## 2020-10-31 NOTE — ED Triage Notes (Signed)
Pt reports abscess to left medial facial cheek area onset 1-2 days ago.  States has been applying cold compresses.  Denies any fevers.

## 2020-10-31 NOTE — Discharge Instructions (Addendum)
Take antibiotic as prescribed Apply warm compress to affected area Return to evaluation if swelling worsens or you develop fever/chills  Follow up with dentist as soon as possible

## 2022-02-23 IMAGING — DX DG FINGER MIDDLE 2+V*L*
3 series · 3 of 3 positions shown · non-contrast
Comparison: None.

CLINICAL DATA: Puncture wound by a sewing machine needle today.
Initial encounter.

EXAM:
LEFT MIDDLE FINGER 2+V

[finger pa]
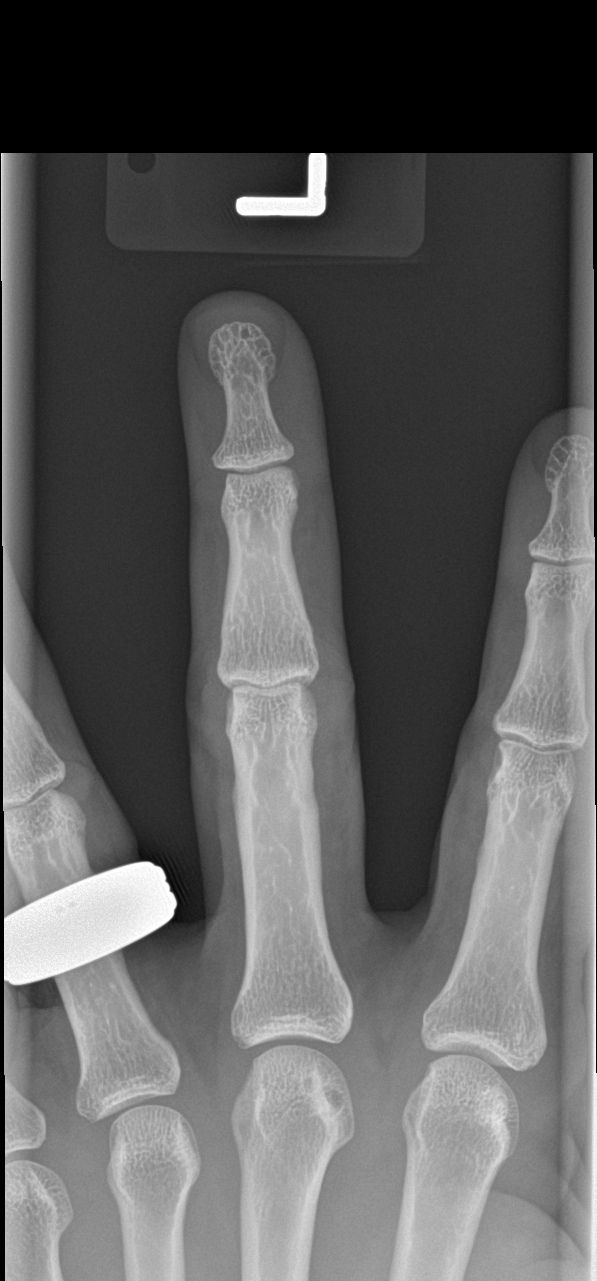

[finger obl]
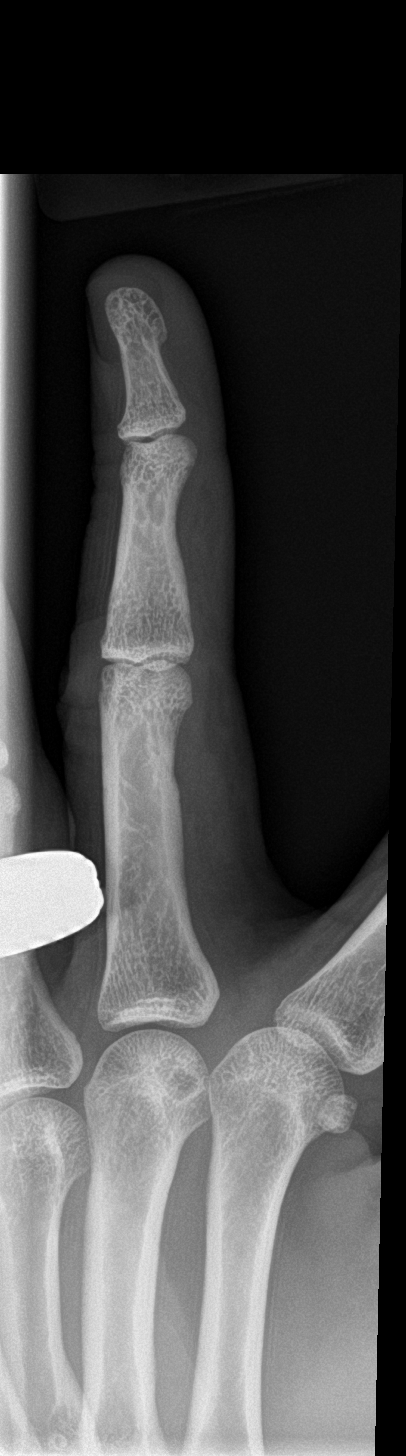

[finger lat]
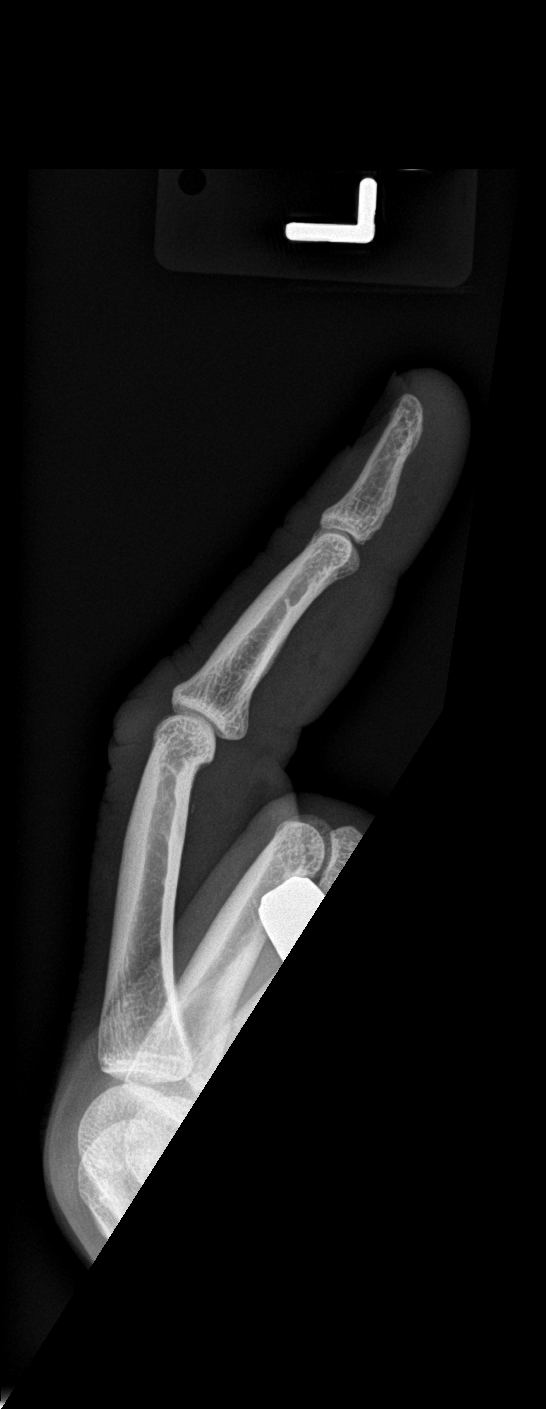

[3 of 3 positions shown; findings below may reference images not displayed]

FINDINGS: There is no evidence of fracture or dislocation. There is no
evidence of arthropathy or other focal bone abnormality. Soft
tissues are unremarkable.
IMPRESSION: Negative exam.

## 2022-09-20 ENCOUNTER — Ambulatory Visit: Payer: 59 | Admitting: Medical

## 2022-09-20 ENCOUNTER — Encounter: Payer: Self-pay | Admitting: Medical

## 2022-09-20 VITALS — BP 130/86 | HR 66 | Ht 66.0 in | Wt 217.4 lb

## 2022-09-20 DIAGNOSIS — H409 Unspecified glaucoma: Secondary | ICD-10-CM

## 2022-09-20 DIAGNOSIS — J301 Allergic rhinitis due to pollen: Secondary | ICD-10-CM

## 2022-09-20 DIAGNOSIS — R053 Chronic cough: Secondary | ICD-10-CM

## 2022-09-20 DIAGNOSIS — K219 Gastro-esophageal reflux disease without esophagitis: Secondary | ICD-10-CM | POA: Diagnosis not present

## 2022-09-20 DIAGNOSIS — R030 Elevated blood-pressure reading, without diagnosis of hypertension: Secondary | ICD-10-CM

## 2022-09-20 MED ORDER — BENZONATATE 200 MG PO CAPS: 200.0000 mg | ORAL_CAPSULE | Freq: Three times a day (TID) | ORAL | 0 refills | Status: DC | PRN

## 2022-09-20 MED ORDER — FLUTICASONE PROPIONATE 50 MCG/ACT NA SUSP: 2.0000 | Freq: Every day | NASAL | 2 refills | Status: DC

## 2022-09-20 MED ORDER — OMEPRAZOLE 40 MG PO CPDR: 40.0000 mg | DELAYED_RELEASE_CAPSULE | Freq: Every day | ORAL | 3 refills | Status: DC

## 2022-09-20 NOTE — Patient Instructions (Signed)
Glaucoma-on medication, follow-up with eye doctors as planned   Chronic cough Begin Tessalon Perle cough drops as needed 2 or 3 times daily Consider going for chest x-ray to rule out worrisome causes of cough I suspect your cough is related to both acid reflux and allergies, so lets try some treatment to target both  Please go to [facility] for your chest xray.   Their hours are 8am - 4:30 pm Monday - Friday.  Take your insurance card with you.  [facility] [phone]   [address] [place], [place] [address]    Allergic rhinitis Begin trial of Flonase nasal spray to help with allergies Consider nasal saline rinse daily such as arm and Hammer saline rinse   Acid reflux Begin trial of omeprazole 1 tablet daily in the morning about 30 minutes before breakfast Avoid foods that make acid worse such as excess tomato-based foods, peppers, hot sauce, citrus such as oranges and grapefruits Do not eat within an hour and a half at bedtime   Elevated blood pressure without diagnosis of hypertension I recommend you check your blood pressures at home 2 or 3 days per week and write these numbers down Goal is to be around 120/70.  We want to stay under 130/80 threshold Limit salt, eat a good amount of fruits and vegetables every day, avoid a lot of fast food and junk food

## 2022-09-20 NOTE — Progress Notes (Signed)
Subjective:  [name] [name] is a 57 y.o. male who presents for Chief Complaint  Patient presents with   Cough    New patient. Has had a cough for a long time off and on. Coughed really hard one time and felt like he pulled something in his chest, feeling better now. Wants to re-establish care.      Here as a returning patient.  Last here once in 2017.  [place] had moved [place] for a while, back here in [place] now.   Here with wife today  Been having a cough for a while.   Coughed so hard recently pulled a muscle in chest.  Been coughing off and on for a month or so.    Nonsmoker.  Gets spring time allergy.  Having some sneezing, some drainage.    No recent fever.  No SOB or wheezing.   No NVD, no abdominal pain.   Occasionally taking cough drops OTC.   Wife says he does get GERD issues.     Elevated blood pressure today-she notes a history of high blood pressure readings in the past but never on blood pressure medication.  He denies chest pain, swelling, palpitations or other.  He says he drinks a good amount of water.  He has a blood pressure cuff at home but they have not been checking blood pressures.   he says his blood pressures have been normal at recent eye doctor visits.  No other aggravating or relieving factors.    No other c/o.  Past Medical History:  Diagnosis Date   Glaucoma    Hypertension    Current Outpatient Medications on File Prior to Visit  Medication Sig Dispense Refill   brimonidine (ALPHAGAN) 0.15 % ophthalmic solution Place 1 drop into both eyes 2 (two) times daily.     dorzolamide-timolol (COSOPT) 22.3-6.8 MG/ML ophthalmic solution Place 1 drop into both eyes 2 (two) times daily.     Latanoprostene Bunod (VYZULTA) 0.024 % SOLN Apply 1 drop to eye at bedtime as needed.     Multiple Vitamin (MULTIVITAMIN) capsule Take 1 capsule by mouth daily.     Netarsudil Dimesylate (RHOPRESSA) 0.02 % SOLN Apply 1 drop to eye at bedtime.     Netarsudil Dimesylate 0.02 %  SOLN Apply 1 drop to eye daily. (Patient not taking: Reported on 09/20/2022)     No current facility-administered medications on file prior to visit.    The following portions of the patient's history were reviewed and updated as appropriate: allergies, current medications, past family history, past medical history, past social history, past surgical history and problem list.  ROS Otherwise as in subjective above     Objective: BP (!) 180/100 Comment: 150 90  Pulse 66   Ht 5\' 6"  (1.676 m)   Wt 217 lb 6.4 oz (98.6 kg)   SpO2 97%   BMI 35.09 kg/m   General appearance: alert, no distress, well developed, well nourished HEENT: normocephalic, sclerae anicteric, conjunctiva pink and moist, TMs pearly, nares with some mucoid discharge, no erythema, pharynx normal Oral cavity: MMM, no lesions Neck: supple, no lymphadenopathy, no thyromegaly, no masses, no bruits Heart: RRR, normal S1, S2, no murmurs Lungs: CTA bilaterally, no wheezes, rhonchi, or rales Abdomen: +bs, soft, non tender, non distended, no masses, no hepatomegaly, no splenomegaly Pulses: 2+ radial pulses, 2+ pedal pulses, normal cap refill Ext: no edema    Assessment: Encounter Diagnoses  Name Primary?   Glaucoma, unspecified glaucoma type, unspecified laterality Yes  Chronic cough    Gastroesophageal reflux disease, unspecified whether esophagitis present    Allergic rhinitis due to pollen, unspecified seasonality    Elevated blood pressure reading in office without diagnosis of hypertension      Plan: Glaucoma-on medication, follow-up with eye doctors as planned   Chronic cough Begin Tessalon Perle cough drops as needed 2 or 3 times daily Consider going for chest x-ray to rule out worrisome causes of cough I suspect your cough is related to both acid reflux and allergies, so lets try some treatment to target both  Please go to [facility] for your chest xray.   Their hours are 8am - 4:30 pm Monday -  Friday.  Take your insurance card with you.  [facility] [phone]   [address] [place], [place] [address]    Allergic rhinitis Begin trial of Flonase nasal spray to help with allergies Consider nasal saline rinse daily such as arm and Hammer saline rinse   Acid reflux Begin trial of omeprazole 1 tablet daily in the morning about 30 minutes before breakfast Avoid foods that make acid worse such as excess tomato-based foods, peppers, hot sauce, citrus such as oranges and grapefruits Do not eat within an hour and a half at bedtime   Elevated blood pressure without diagnosis of hypertension I recommend you check your blood pressures at home 2 or 3 days per week and write these numbers down Goal is to be around 120/70.  We want to stay under 130/80 threshold Limit salt, eat a good amount of fruits and vegetables every day, avoid a lot of fast food and junk food    [place] was seen today for cough.  Diagnoses and all orders for this visit:  Glaucoma, unspecified glaucoma type, unspecified laterality  Chronic cough  Gastroesophageal reflux disease, unspecified whether esophagitis present  Allergic rhinitis due to pollen, unspecified seasonality  Elevated blood pressure reading in office without diagnosis of hypertension   Follow up: pending xray, f/u soon for well visit

## 2022-11-23 ENCOUNTER — Ambulatory Visit: Payer: 59 | Admitting: Medical

## 2022-11-23 ENCOUNTER — Encounter: Payer: Self-pay | Admitting: Medical

## 2022-11-23 VITALS — BP 120/70 | HR 68 | Ht 66.0 in | Wt 213.2 lb

## 2022-11-23 DIAGNOSIS — Z1211 Encounter for screening for malignant neoplasm of colon: Secondary | ICD-10-CM | POA: Diagnosis not present

## 2022-11-23 DIAGNOSIS — H409 Unspecified glaucoma: Secondary | ICD-10-CM

## 2022-11-23 DIAGNOSIS — H5440 Blindness, one eye, unspecified eye: Secondary | ICD-10-CM

## 2022-11-23 DIAGNOSIS — Z125 Encounter for screening for malignant neoplasm of prostate: Secondary | ICD-10-CM | POA: Diagnosis not present

## 2022-11-23 DIAGNOSIS — Z136 Encounter for screening for cardiovascular disorders: Secondary | ICD-10-CM

## 2022-11-23 DIAGNOSIS — N529 Male erectile dysfunction, unspecified: Secondary | ICD-10-CM | POA: Insufficient documentation

## 2022-11-23 DIAGNOSIS — Z0000 Encounter for general adult medical examination without abnormal findings: Secondary | ICD-10-CM | POA: Diagnosis not present

## 2022-11-23 DIAGNOSIS — Z6834 Body mass index (BMI) 34.0-34.9, adult: Secondary | ICD-10-CM | POA: Insufficient documentation

## 2022-11-23 DIAGNOSIS — Z13220 Encounter for screening for lipoid disorders: Secondary | ICD-10-CM | POA: Diagnosis not present

## 2022-11-23 DIAGNOSIS — K219 Gastro-esophageal reflux disease without esophagitis: Secondary | ICD-10-CM

## 2022-11-23 DIAGNOSIS — Z131 Encounter for screening for diabetes mellitus: Secondary | ICD-10-CM

## 2022-11-23 LAB — POCT URINALYSIS DIP (PROADVANTAGE DEVICE)
Bilirubin, UA: NEGATIVE
Blood, UA: NEGATIVE
Glucose, UA: NEGATIVE mg/dL
Ketones, POC UA: NEGATIVE mg/dL
Nitrite, UA: NEGATIVE
Protein Ur, POC: NEGATIVE mg/dL
Specific Gravity, Urine: 1.01
Urobilinogen, Ur: NEGATIVE
pH, UA: 6 (ref 5.0–8.0)

## 2022-11-23 MED ORDER — PSEUDOEPHEDRINE HCL 30 MG PO TABS: 30.0000 mg | ORAL_TABLET | Freq: Three times a day (TID) | ORAL | 0 refills | Status: DC | PRN

## 2022-11-23 MED ORDER — SILDENAFIL CITRATE 100 MG PO TABS: 50.0000 mg | ORAL_TABLET | Freq: Every day | ORAL | 1 refills | Status: AC | PRN

## 2022-11-23 MED ORDER — AMOXICILLIN 875 MG PO TABS: 875.0000 mg | ORAL_TABLET | Freq: Two times a day (BID) | ORAL | 0 refills | Status: DC

## 2022-11-23 MED ORDER — AMOXICILLIN 875 MG PO TABS: 875.0000 mg | ORAL_TABLET | Freq: Two times a day (BID) | ORAL | 0 refills | Status: AC

## 2022-11-23 NOTE — Patient Instructions (Signed)
This visit was a preventative care visit, also known as wellness visit or routine physical.   Topics typically include healthy lifestyle, diet, exercise, preventative care, vaccinations, sick and well care, proper use of emergency dept and after hours care, as well as other concerns.     Separate significant issues discussed: Glaucoma, blind -sees ophthalmology, on drops  Erectile Dysfunction - Reviewed pathophysiology and differential diagnosis of erectile dysfunction with the patient.  Discussed treatment options.  Advised he talk with his eye doctor before beginning trial of Viagra.  Discussed potential risks of medications including hypotension and priapism.  Discussed proper use of medication.  Questions were answered.  Recheck 2 wk  BMI 34-work labs to lose weight through healthy diet and exercise    General Recommendations: Continue to return yearly for your annual wellness and preventative care visits.  This gives [place] a chance to discuss healthy lifestyle, exercise, vaccinations, review your chart record, and perform screenings where appropriate.  I recommend you see your eye doctor yearly for routine vision care.  I recommend you see your dentist yearly for routine dental care including hygiene visits twice yearly.   Vaccination  Immunization History  Administered Date(s) Administered   Tdap 05/16/2021    Vaccine recommendations: Shingrix Tdap  Vaccines administered today: None, declines   Screening for cancer: Colon cancer screening: We will refer you for screening colonoscopy  Prostate Cancer screening: The recommended prostate cancer screening test is a blood test called the prostate-specific antigen (PSA) test. PSA is a protein that is made in the prostate. As you age, your prostate naturally produces more PSA. Abnormally high PSA levels may be caused by: Prostate cancer. An enlarged prostate that is not caused by cancer (benign prostatic hyperplasia, or BPH).  This condition is very common in older men. A prostate gland infection (prostatitis) or urinary tract infection. Certain medicines such as male hormones (like testosterone) or other medicines that raise testosterone levels. A rectal exam may be done as part of prostate cancer screening to help provide information about the size of your prostate gland. When a rectal exam is performed, it should be done after the PSA level is drawn to avoid any effect on the results.   Skin cancer screening: Check your skin regularly for new changes, growing lesions, or other lesions of concern Come in for evaluation if you have skin lesions of concern.   Lung cancer screening: If you have a greater than 20 pack year history of tobacco use, then you may qualify for lung cancer screening with a chest CT scan.   Please call your insurance company to inquire about coverage for this test.   Pancreatic cancer:  no current screening test is available or routinely recommended. (risk factors: smoking, overweight or obese, diabetes, chronic pancreatitis, work exposure - dry cleaning, metal working, 55yo>, M>F, Tree surgeon, family hx/o, hereditary breast, ovarian, melanoma, lynch, peutz-jeghers).  Symptoms: jaundice, dark urine, light color or greasy stools, itchy skin, belly or back pain, weight loss, poor appetite, nause, vomiting, liver enlargement, DVT/blood clots.   We currently don't have screenings for other cancers besides breast, cervical, colon, and lung cancers.  If you have a strong family history of cancer or have other cancer screening concerns, please let me know.  Genetic testing referral is an option for individuals with high cancer risk in the family.  There are some other cancer screenings in development currently.   Bone health: Get at least 150 minutes of aerobic exercise weekly Get weight  bearing exercise at least once weekly Bone density test:  A bone density test is an imaging test that uses  a type of X-ray to measure the amount of calcium and other minerals in your bones. The test may be used to diagnose or screen you for a condition that causes weak or thin bones (osteoporosis), predict your risk for a broken bone (fracture), or determine how well your osteoporosis treatment is working. The bone density test is recommended for females 65 and older, or females or males <65 if certain risk factors such as thyroid disease, long term use of steroids such as for asthma or rheumatological issues, vitamin D deficiency, estrogen deficiency, family history of osteoporosis, self or family history of fragility fracture in first degree relative.    Heart health: Get at least 150 minutes of aerobic exercise weekly Limit alcohol It is important to maintain a healthy blood pressure and healthy cholesterol numbers  Heart disease screening: Screening for heart disease includes screening for blood pressure, fasting lipids, glucose/diabetes screening, BMI height to weight ratio, reviewed of smoking status, physical activity, and diet.    Goals include blood pressure 120/80 or less, maintaining a healthy lipid/cholesterol profile, preventing diabetes or keeping diabetes numbers under good control, not smoking or using tobacco products, exercising most days per week or at least 150 minutes per week of exercise, and eating healthy variety of fruits and vegetables, healthy oils, and avoiding unhealthy food choices like fried food, fast food, high sugar and high cholesterol foods.    Other tests may possibly include EKG test, CT coronary calcium score, echocardiogram, exercise treadmill stress test.     Consider CT coronary heart test/screening    Vascular disease screening: For higher risk individuals including smokers, diabetics, patients with known heart disease or high blood pressure, kidney disease, and others, screening for vascular disease or atherosclerosis of the arteries is available.   Examples may include carotid ultrasound, abdominal aortic ultrasound, ABI blood flow screening in the legs, thoracic aorta screening.    Medical care options: I recommend you continue to seek care here first for routine care.  We try really hard to have available appointments Monday through Friday daytime hours for sick visits, acute visits, and physicals.  Urgent care should be used for after hours and weekends for significant issues that cannot wait till the next day.  The emergency department should be used for significant potentially life-threatening emergencies.  The emergency department is expensive, can often have long wait times for less significant concerns, so try to utilize primary care, urgent care, or telemedicine when possible to avoid unnecessary trips to the emergency department.  Virtual visits and telemedicine have been introduced since the pandemic started in 2020, and can be convenient ways to receive medical care.  We offer virtual appointments as well to assist you in a variety of options to seek medical care.   Legal  Take the time to do a last will and testament, Advanced Directives including Health Care Power of Attorney and Living Will documents.  Don't leave your family with burdens that can be handled ahead of time.   Advanced Directives: I recommend you consider completing a Health Care Power of Attorney and Living Will.   These documents respect your wishes and help alleviate burdens on your loved ones if you were to become terminally ill or be in a position to need those documents enforced.    You can complete Advanced Directives yourself, have them notarized, then have copies made  for our office, for you and for anybody you feel should have them in safe keeping.  Or, you can have an attorney prepare these documents.   If you haven't updated your Last Will and Testament in a while, it may be worthwhile having an attorney prepare these documents together and save on  some costs.       Spiritual and Emotional Health Keeping a healthy spiritual life can help you better manage your physical health. Your spiritual life can help you to cope with any issues that may arise with your physical health.  Balance can keep [place] healthy and help [place] to recover.  If you are struggling with your spiritual health there are questions that you may want to ask yourself:  What makes me feel most complete? When do I feel most connected to the rest of the world? Where do I find the most inner strength? What am I doing when I feel whole?  Helpful tips: Being in nature. Some people feel very connected and at peace when they are walking outdoors or are outside. Helping others. Some feel the largest sense of wellbeing when they are of service to others. Being of service can take on many forms. It can be doing volunteer work, being kind to strangers, or offering a hand to a friend in need. Gratitude. Some people find they feel the most connected when they remain grateful. They may make lists of all the things they are grateful for or say a thank you out loud for all they have.    Emotional Health Are you in tune with your emotional health?  Check out this link: http://www.marquez-love.com/    Financial Health Make sure you use a budget for your personal finances Make sure you are insured against risks (health insurance, life insurance, auto insurance, etc) Save more, spend less Set financial goals If you need help in this area, good resources include counseling through [employer] or other community resources, have a meeting with a Social research officer, government, and a good resource is the [employer]
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
## 2022-11-23 NOTE — Progress Notes (Signed)
Pt was notified and will contact eye doctor

## 2022-11-24 LAB — PSA: Prostate Specific Ag, Serum: 1 ng/mL (ref 0.0–4.0)

## 2022-11-24 LAB — CBC WITH DIFFERENTIAL/PLATELET
Basophils Absolute: 0.1 10*3/uL (ref 0.0–0.2)
Basos: 1 %
EOS (ABSOLUTE): 0.3 10*3/uL (ref 0.0–0.4)
Eos: 5 %
Hematocrit: 38.6 % (ref 37.5–51.0)
Hemoglobin: 12.7 g/dL — ABNORMAL LOW (ref 13.0–17.7)
Immature Grans (Abs): 0 10*3/uL (ref 0.0–0.1)
Immature Granulocytes: 0 %
Lymphocytes Absolute: 1.2 10*3/uL (ref 0.7–3.1)
Lymphs: 25 %
MCH: 28.4 pg (ref 26.6–33.0)
MCHC: 32.9 g/dL (ref 31.5–35.7)
MCV: 86 fL (ref 79–97)
Monocytes Absolute: 0.4 10*3/uL (ref 0.1–0.9)
Monocytes: 8 %
Neutrophils Absolute: 2.8 10*3/uL (ref 1.4–7.0)
Neutrophils: 61 %
Platelets: 370 10*3/uL (ref 150–450)
RBC: 4.47 x10E6/uL (ref 4.14–5.80)
RDW: 11.7 % (ref 11.6–15.4)
WBC: 4.7 10*3/uL (ref 3.4–10.8)

## 2022-11-24 LAB — HEMOGLOBIN A1C
Est. average glucose Bld gHb Est-mCnc: 128 mg/dL
Hgb A1c MFr Bld: 6.1 % — ABNORMAL HIGH (ref 4.8–5.6)

## 2022-11-24 LAB — COMPREHENSIVE METABOLIC PANEL
ALT: 27 IU/L (ref 0–44)
AST: 24 IU/L (ref 0–40)
Albumin: 4.6 g/dL (ref 3.8–4.9)
Alkaline Phosphatase: 48 IU/L (ref 44–121)
BUN/Creatinine Ratio: 13 (ref 9–20)
BUN: 13 mg/dL (ref 6–24)
Bilirubin Total: 0.7 mg/dL (ref 0.0–1.2)
CO2: 22 mmol/L (ref 20–29)
Calcium: 10.1 mg/dL (ref 8.7–10.2)
Chloride: 101 mmol/L (ref 96–106)
Creatinine, Ser: 0.99 mg/dL (ref 0.76–1.27)
Globulin, Total: 3.1 g/dL (ref 1.5–4.5)
Glucose: 120 mg/dL — ABNORMAL HIGH (ref 70–99)
Potassium: 4.6 mmol/L (ref 3.5–5.2)
Sodium: 137 mmol/L (ref 134–144)
Total Protein: 7.7 g/dL (ref 6.0–8.5)
eGFR: 90 mL/min/{1.73_m2} (ref >59)

## 2022-11-24 LAB — HEPATITIS C ANTIBODY: Hep C Virus Ab: NONREACTIVE

## 2022-11-24 LAB — LIPID PANEL
Chol/HDL Ratio: 4.1 ratio (ref 0.0–5.0)
Cholesterol, Total: 201 mg/dL — ABNORMAL HIGH (ref 100–199)
HDL: 49 mg/dL (ref >39)
LDL Chol Calc (NIH): 137 mg/dL — ABNORMAL HIGH (ref 0–99)
Triglycerides: 80 mg/dL (ref 0–149)
VLDL Cholesterol Cal: 15 mg/dL (ref 5–40)

## 2022-11-24 LAB — HIV ANTIBODY (ROUTINE TESTING W REFLEX): HIV Screen 4th Generation wRfx: NONREACTIVE

## 2022-11-24 LAB — TSH: TSH: 2.65 u[IU]/mL (ref 0.450–4.500)

## 2022-11-24 LAB — TESTOSTERONE: Testosterone: 441 ng/dL (ref 264–916)

## 2022-11-24 NOTE — Progress Notes (Signed)
Results sent through MyChart

## 2023-01-25 ENCOUNTER — Encounter: Payer: Self-pay | Admitting: Medical

## 2023-05-17 DIAGNOSIS — R7303 Prediabetes: Secondary | ICD-10-CM

## 2023-05-17 HISTORY — DX: Prediabetes: R73.03

## 2023-07-23 ENCOUNTER — Encounter (HOSPITAL_COMMUNITY): Payer: Self-pay

## 2023-07-23 ENCOUNTER — Ambulatory Visit (HOSPITAL_COMMUNITY)
Admission: EM | Admit: 2023-07-23 | Discharge: 2023-07-23 | Disposition: A | Discharge status: Home / Self Care | Attending: Family Medicine | Admitting: Family Medicine

## 2023-07-23 DIAGNOSIS — M109 Gout, unspecified: Secondary | ICD-10-CM | POA: Insufficient documentation

## 2023-07-23 LAB — CBC
HCT: 40.2 % (ref 39.0–52.0)
Hemoglobin: 13.1 g/dL (ref 13.0–17.0)
MCH: 28.4 pg (ref 26.0–34.0)
MCHC: 32.6 g/dL (ref 30.0–36.0)
MCV: 87.2 fL (ref 80.0–100.0)
Platelets: 339 10*3/uL (ref 150–400)
RBC: 4.61 MIL/uL (ref 4.22–5.81)
RDW: 11.9 % (ref 11.5–15.5)
WBC: 6.6 10*3/uL (ref 4.0–10.5)
nRBC: 0 % (ref 0.0–0.2)

## 2023-07-23 LAB — BASIC METABOLIC PANEL
Anion gap: 12 (ref 5–15)
BUN: 10 mg/dL (ref 6–20)
CO2: 23 mmol/L (ref 22–32)
Calcium: 9.6 mg/dL (ref 8.9–10.3)
Chloride: 103 mmol/L (ref 98–111)
Creatinine, Ser: 0.89 mg/dL (ref 0.61–1.24)
GFR, Estimated: >60 mL/min (ref >60)
Glucose, Bld: 125 mg/dL — ABNORMAL HIGH (ref 70–99)
Potassium: 4.4 mmol/L (ref 3.5–5.1)
Sodium: 138 mmol/L (ref 135–145)

## 2023-07-23 LAB — URIC ACID: Uric Acid, Serum: 7.8 mg/dL (ref 3.7–8.6)

## 2023-07-23 MED ORDER — COLCHICINE 0.6 MG PO TABS: 0.6000 mg | ORAL_TABLET | Freq: Every day | ORAL | 0 refills | Status: DC | PRN

## 2023-07-23 MED ORDER — PREDNISONE 20 MG PO TABS: 40.0000 mg | ORAL_TABLET | Freq: Every day | ORAL | 0 refills | Status: AC
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
## 2023-07-23 NOTE — ED Triage Notes (Signed)
 Patient states that he began having left great toe pain and swelling x 3 days. Patient states pain was worse yesterday because his job requires him to be on his feet for 8 hours.  Patient had Tylenol at 0115 today for pain.

## 2023-07-23 NOTE — Discharge Instructions (Signed)
 Take prednisone 20 mg--2 daily for 3 days; since your glaucoma is well-controlled, this medication most likely should not cause problems, especially because you will be taking it for very short time.  If you do start noticing any changes in your vision before the 3 days is up, please stop this medication anyway.  Colchicine 0.6 mg--1-2 tablets daily as needed for gout pain

## 2023-07-30 ENCOUNTER — Encounter (HOSPITAL_COMMUNITY): Payer: Self-pay | Admitting: *Deleted

## 2023-07-30 ENCOUNTER — Ambulatory Visit (INDEPENDENT_AMBULATORY_CARE_PROVIDER_SITE_OTHER)
Admission: EM | Admit: 2023-07-30 | Discharge: 2023-07-30 | Disposition: A | Discharge status: Home / Self Care | Source: Home / Self Care | Attending: Family Medicine | Admitting: Family Medicine

## 2023-07-30 ENCOUNTER — Encounter (HOSPITAL_COMMUNITY): Payer: Self-pay

## 2023-07-30 ENCOUNTER — Emergency Department (HOSPITAL_COMMUNITY)
Admission: EM | Admit: 2023-07-30 | Discharge: 2023-07-30 | Disposition: A | Discharge status: Home / Self Care | Attending: Emergency Medicine | Admitting: Emergency Medicine

## 2023-07-30 ENCOUNTER — Other Ambulatory Visit: Payer: Self-pay

## 2023-07-30 ENCOUNTER — Telehealth (HOSPITAL_COMMUNITY): Payer: Self-pay | Admitting: Family Medicine

## 2023-07-30 ENCOUNTER — Emergency Department (HOSPITAL_BASED_OUTPATIENT_CLINIC_OR_DEPARTMENT_OTHER)

## 2023-07-30 DIAGNOSIS — I82442 Acute embolism and thrombosis of left tibial vein: Secondary | ICD-10-CM | POA: Diagnosis not present

## 2023-07-30 DIAGNOSIS — M109 Gout, unspecified: Secondary | ICD-10-CM

## 2023-07-30 DIAGNOSIS — R609 Edema, unspecified: Secondary | ICD-10-CM | POA: Diagnosis not present

## 2023-07-30 DIAGNOSIS — M79662 Pain in left lower leg: Secondary | ICD-10-CM | POA: Insufficient documentation

## 2023-07-30 DIAGNOSIS — I160 Hypertensive urgency: Secondary | ICD-10-CM | POA: Insufficient documentation

## 2023-07-30 DIAGNOSIS — I10 Essential (primary) hypertension: Secondary | ICD-10-CM

## 2023-07-30 DIAGNOSIS — Z7901 Long term (current) use of anticoagulants: Secondary | ICD-10-CM | POA: Diagnosis not present

## 2023-07-30 LAB — D-DIMER, QUANTITATIVE: D-Dimer, Quant: 0.97 ug{FEU}/mL — ABNORMAL HIGH (ref 0.00–0.50)

## 2023-07-30 MED ORDER — AMLODIPINE BESYLATE 2.5 MG PO TABS: 2.5000 mg | ORAL_TABLET | Freq: Every day | ORAL | 1 refills | Status: DC

## 2023-07-30 MED ORDER — ALLOPURINOL 100 MG PO TABS: 100.0000 mg | ORAL_TABLET | Freq: Every day | ORAL | 0 refills | Status: DC

## 2023-07-30 MED ORDER — PREDNISONE 20 MG PO TABS: 20.0000 mg | ORAL_TABLET | Freq: Every day | ORAL | 0 refills | Status: AC

## 2023-07-30 MED ORDER — APIXABAN (ELIQUIS) VTE STARTER PACK (10MG AND 5MG)
ORAL_TABLET | ORAL | 0 refills | Status: DC
Start: 2023-07-30 — End: 2023-09-18

## 2023-07-30 MED ORDER — APIXABAN 5 MG PO TABS
10.0000 mg | ORAL_TABLET | Freq: Once | ORAL | Status: AC
  Administered 2023-07-30: 10 mg via ORAL
  Filled 2023-07-30: qty 2

## 2023-07-30 MED ORDER — NAPROXEN 500 MG PO TABS
500.0000 mg | ORAL_TABLET | Freq: Two times a day (BID) | ORAL | 0 refills | Status: DC
Start: 2023-07-30 — End: 2023-07-30

## 2023-07-30 MED ORDER — AMLODIPINE BESYLATE 5 MG PO TABS
2.5000 mg | ORAL_TABLET | Freq: Once | ORAL | Status: AC
  Administered 2023-07-30: 2.5 mg via ORAL
  Filled 2023-07-30: qty 1

## 2023-07-30 MED ORDER — APIXABAN (ELIQUIS) EDUCATION KIT FOR DVT/PE PATIENTS
PACK | Freq: Once | Status: AC
  Filled 2023-07-30: qty 1

## 2023-07-30 NOTE — ED Triage Notes (Signed)
 LT foot pain . Pt seen last week and has not improved. Pt now has new Lt calf muscle pain.
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
## 2023-07-30 NOTE — ED Provider Notes (Signed)
 [place] EMERGENCY DEPARTMENT AT [facility] Provider Note   CSN: [id number] Arrival date & time: 07/30/23  1542     History  Chief Complaint  Patient presents with   Lt leg pain    [name] [name] is a 58 y.o. male.  HPI 58 year old male presents with a left calf DVT.  The patient states that he has been having left calf pain for a couple days.  He had gout last week that is essentially now resolved.  Went to urgent care and they did a D-dimer which was elevated and told him to come to the ER.  He has no chest pain, shortness of breath, or weakness/numbness in the extremities.  No dizziness or lightheadedness.  No headache.  No recent travel or surgery.  No history of DVT in the past.  He is found to be hypertensive as well and states that he has been trying to use herbal treatments for this but knows that his blood pressure does run high.  Home Medications Prior to Admission medications   Medication Sig Start Date End Date Taking? Authorizing Provider  amLODipine (NORVASC) 2.5 MG tablet Take 1 tablet (2.5 mg total) by mouth daily. 07/30/23  Yes Pricilla Loveless, MD  APIXABAN Everlene Balls) VTE STARTER PACK (10MG  AND 5MG ) Take as directed on package: start with two-5mg  tablets twice daily for 7 days. On day 8, switch to one-5mg  tablet twice daily. 07/30/23  Yes Pricilla Loveless, MD  allopurinol (ZYLOPRIM) 100 MG tablet Take 1 tablet (100 mg total) by mouth daily. 07/30/23   Doreene Eland, MD  brimonidine (ALPHAGAN) 0.15 % ophthalmic solution Place 1 drop into both eyes 2 (two) times daily.    [provider]  dorzolamide-timolol (COSOPT) 22.3-6.8 MG/ML ophthalmic solution Place 1 drop into both eyes 2 (two) times daily.    [provider]  fluticasone (FLONASE) 50 MCG/ACT nasal spray Place 2 sprays into both nostrils daily. 09/20/22   Tysinger, Kermit Balo, PA-C  Multiple Vitamin (MULTIVITAMIN) capsule Take 1 capsule by mouth daily. Patient not taking: Reported  on 11/23/2022    [provider]  Netarsudil Dimesylate 0.02 % SOLN Apply 1 drop to eye daily.    [provider]  prednisoLONE acetate (PRED FORTE) 1 % ophthalmic suspension Place 1 drop into the right eye 2 (two) times daily. 06/08/22   [provider]  predniSONE (DELTASONE) 20 MG tablet Take 1 tablet (20 mg total) by mouth daily with breakfast for 3 days. 07/30/23 08/02/23  Doreene Eland, MD  sildenafil (VIAGRA) 100 MG tablet Take 0.5-1 tablets (50-100 mg total) by mouth daily as needed for erectile dysfunction. 11/23/22   Tysinger, Kermit Balo, PA-C      Allergies    Patient has no known allergies.    Review of Systems   Review of Systems  Respiratory:  Negative for shortness of breath.   Cardiovascular:  Negative for chest pain.  Musculoskeletal:  Positive for myalgias.  Neurological:  Negative for light-headedness.    Physical Exam Updated Vital Signs BP (!) 180/102   Pulse 81   Temp 98.3 F (36.8 C)   Resp 18   Ht 5\' 7"  (1.702 m)   Wt 93 kg   SpO2 98%   BMI 32.11 kg/m  Physical Exam Vitals and nursing note reviewed.  Constitutional:      Appearance: He is well-developed.  HENT:     Head: Normocephalic and atraumatic.  Cardiovascular:     Rate and Rhythm: Normal  rate and regular rhythm.     Pulses:          Dorsalis pedis pulses are 2+ on the left side.       Posterior tibial pulses are 2+ on the left side.  Pulmonary:     Effort: Pulmonary effort is normal.  Skin:    General: Skin is warm and dry.  Neurological:     Mental Status: He is alert.     ED Results / Procedures / Treatments   Labs (all labs ordered are listed, but only abnormal results are displayed) Labs Reviewed - No data to display  EKG None  Radiology VAS [place] LOWER EXTREMITY VENOUS (DVT) (ONLY [facility]) Result Date: 07/30/2023  Lower Venous DVT Study Patient Name:  [name] [name]  Date of Exam:   07/30/2023 Medical Rec #: [id number]           Accession #:     [id number] Date of Birth: January 27, 1966           Patient Gender: M Patient Age:   74 years Exam Location:  [facility] Procedure:      VAS [place] LOWER EXTREMITY VENOUS (DVT) Referring Phys: Benjiman Core --------------------------------------------------------------------------------  Indications: Calf pain and swelling. Elevated D-Dimer at Urgent Care.  Comparison Study: No prior study on file Performing Technologist: Sherren Kerns RVS  Examination Guidelines: A complete evaluation includes B-mode imaging, spectral Doppler, color Doppler, and power Doppler as needed of all accessible portions of each vessel. Bilateral testing is considered an integral part of a complete examination. Limited examinations for reoccurring indications may be performed as noted. The reflux portion of the exam is performed with the patient in reverse Trendelenburg.  +-----+---------------+---------+-----------+----------+--------------+ RIGHTCompressibilityPhasicitySpontaneityPropertiesThrombus Aging +-----+---------------+---------+-----------+----------+--------------+ CFV  Full           Yes      Yes                                 +-----+---------------+---------+-----------+----------+--------------+   +---------+---------------+---------+-----------+----------+--------------+ LEFT     CompressibilityPhasicitySpontaneityPropertiesThrombus Aging +---------+---------------+---------+-----------+----------+--------------+ CFV      Full                                                        +---------+---------------+---------+-----------+----------+--------------+ SFJ      Full                                                        +---------+---------------+---------+-----------+----------+--------------+ FV Prox  Full                                                        +---------+---------------+---------+-----------+----------+--------------+ FV Mid   Full                                                         +---------+---------------+---------+-----------+----------+--------------+  FV DistalFull                                                        +---------+---------------+---------+-----------+----------+--------------+ PFV      Full                                                        +---------+---------------+---------+-----------+----------+--------------+ POP      Full                                                        +---------+---------------+---------+-----------+----------+--------------+ PTV      Full                                                        +---------+---------------+---------+-----------+----------+--------------+ PERO     Full                                                        +---------+---------------+---------+-----------+----------+--------------+ Gastroc  Full                                                        +---------+---------------+---------+-----------+----------+--------------+ TP trunk None           No       No                   Acute          +---------+---------------+---------+-----------+----------+--------------+    Summary: RIGHT: - No evidence of common femoral vein obstruction.   LEFT: - Findings consistent with acute deep vein thrombosis involving the Tibioperoneal trunk.  - Ultrasound characteristics of enlarged lymph nodes noted in the groin.  *See table(s) above for measurements and observations.    Preliminary     Procedures Procedures    Medications Ordered in ED Medications  amLODipine (NORVASC) tablet 2.5 mg (has no administration in time range)  apixaban (ELIQUIS) tablet 10 mg (has no administration in time range)    ED Course/ Medical Decision Making/ A&P                                 Medical Decision Making Risk Prescription drug management.   Patient is found to have a DVT.  It seems that the D-dimer was sent solely for this purpose  and he does not have any chest symptoms to suggest that he has a PE.  The DVT is also small and  distal.  Discussed with patient and wife, they prefer anticoagulation and so will be started on Eliquis.  Counseled on no aspirin or NSAIDs.  Again, low suspicion that he has a PE and so I do not think he needs a CTA.  He also has asymptomatic hypertension which seems to be a more chronic problem as well and he has been recommended to start on meds and he is currently willing to start.  Also discussed diet and exercise.  Discussed need for close outpatient follow-up.  Will start on Norvasc.  Given first dose of Norvasc and Eliquis here.        Final Clinical Impression(s) / ED Diagnoses Final diagnoses:  Acute deep vein thrombosis (DVT) of tibial vein of left lower extremity (HCC)  Hypertensive urgency    Rx / DC Orders ED Discharge Orders          Ordered    amLODipine (NORVASC) 2.5 MG tablet  Daily        07/30/23 1856    APIXABAN (ELIQUIS) VTE STARTER PACK (10MG  AND 5MG )       Note to Pharmacy: If starter pack unavailable, substitute with seventy-four 5 mg apixaban tabs following the above SIG directions.   07/30/23 1856              Pricilla Loveless, MD 07/30/23 2051

## 2023-07-30 NOTE — Progress Notes (Signed)
 VASCULAR LAB    Left lower extremity venous duplex has been performed.  See CV proc for preliminary results.   Cambren Helm, RVT 07/30/2023, 5:34 PM

## 2023-07-30 NOTE — Discharge Instructions (Addendum)
 You were diagnosed with a blood clot in your left calf today.  We are putting you on a blood thinner called Eliquis.  You were given your first dose tonight.  This is a powerful blood thinning medication.  You are NOT allowed to take aspirin or NSAIDs such as ibuprofen, Advil, Aleve, Naprosyn, etc.  You may still take Tylenol.  Your urgent care doctor prescribed naproxen today, do NOT pick this medicine up.  We are also putting you on a blood pressure medicine as your blood pressure is quite elevated today.  You were given your first dose tonight.  If you develop chest pain, shortness of breath, headache, or any other new/concerning symptoms then return to the ER or call 911.

## 2023-07-30 NOTE — Discharge Instructions (Addendum)
 It was nice seeing you. I am sorry your pain still persists, although it is better. We can go ahead and stop your colchicine.  Start Naproxen twice daily and Allopurinol daily till you see your PCP. I also gave you a few more days of oral steroids. We checked your blood for d-dimer to make sure no clot in your leg. See [place] soon if symptoms worsen. Please see PCP soon for your BP management.

## 2023-07-30 NOTE — Telephone Encounter (Signed)
 D-dimer elevated I still doubt DVT. However, given his symptoms ED visit recommended for duplex US. He agreed with the plan. He will head over today.

## 2023-07-30 NOTE — ED Triage Notes (Signed)
 Pt came in via POV d/t UC sending here for a CT since his blood work there showed possibility of a blood clot in his Lt leg. Pt reports his Lt calf started hurting him since yesterday. A/Ox4, rates his pain 7/10 when he is bearing weight, no pain if he is sitting.

## 2023-08-03 ENCOUNTER — Ambulatory Visit: Admitting: Medical

## 2023-08-03 VITALS — BP 130/84 | HR 69 | Temp 97.4°F | Resp 16 | Wt 209.0 lb

## 2023-08-03 DIAGNOSIS — Z6834 Body mass index (BMI) 34.0-34.9, adult: Secondary | ICD-10-CM

## 2023-08-03 DIAGNOSIS — I82452 Acute embolism and thrombosis of left peroneal vein: Secondary | ICD-10-CM | POA: Diagnosis not present

## 2023-08-03 DIAGNOSIS — Z1211 Encounter for screening for malignant neoplasm of colon: Secondary | ICD-10-CM | POA: Diagnosis not present

## 2023-08-03 DIAGNOSIS — E790 Hyperuricemia without signs of inflammatory arthritis and tophaceous disease: Secondary | ICD-10-CM

## 2023-08-03 DIAGNOSIS — R591 Generalized enlarged lymph nodes: Secondary | ICD-10-CM

## 2023-08-03 DIAGNOSIS — I10 Essential (primary) hypertension: Secondary | ICD-10-CM

## 2023-08-03 NOTE — Progress Notes (Signed)
 Subjective:  [name] [name] is a 58 y.o. male who presents for Chief Complaint  Patient presents with   Hospitalization Follow-up    Hospital follow-up DVT from left calf     Here for hospital follow-up.  Accompanied by his wife today.  He was seen in Emergency Department 07/30/2023 for DVT of left leg.  No specific precipitating factor.  No recent long travel, surgery, injury, trauma, no history of DVT or PE.  Non-smoker.  He does report that he does use some over-the-counter supplements that may mimic testosterone, [name] [name].  He used these for erectile dysfunction concerns, trying to get fuller erection.  He has started the [name] [name] and doing okay on this so far.  He just bought some compression socks he is using.  No family history of DVT, PE or frequent miscarriages but mother and brother both have a history of anemia and blood disorder.  Dad was an alcoholic and has cirrhosis.  He would like a referral to cardiology due to new onset high blood pressure.  He was started on amlodipine given high blood pressures.  No new symptoms or concerns today.  No other aggravating or relieving factors.    No other c/o.  Past Medical History:  Diagnosis Date   Allergy    Erectile dysfunction    GERD (gastroesophageal reflux disease)    Glaucoma    Retinal detachment    Current Outpatient Medications on File Prior to Visit  Medication Sig Dispense Refill   allopurinol (ZYLOPRIM) 100 MG tablet Take 1 tablet (100 mg total) by mouth daily. 30 tablet 0   amLODipine (NORVASC) 2.5 MG tablet Take 1 tablet (2.5 mg total) by mouth daily. 30 tablet 1   APIXABAN ([name] [name]) VTE STARTER PACK (10MG  AND 5MG ) Take as directed on package: start with two-5mg  tablets twice daily for 7 days. On day 8, switch to one-5mg  tablet twice daily. 74 each 0   brimonidine (ALPHAGAN) 0.15 % ophthalmic solution Place 1 drop into both eyes 2 (two) times daily.     dorzolamide-timolol (COSOPT) 22.3-6.8 MG/ML  ophthalmic solution Place 1 drop into both eyes 2 (two) times daily.     fluticasone (FLONASE) 50 MCG/ACT nasal spray Place 2 sprays into both nostrils daily. 16 g 2   Multiple Vitamin (MULTIVITAMIN) capsule Take 1 capsule by mouth daily.     Netarsudil Dimesylate 0.02 % SOLN Apply 1 drop to eye daily.     prednisoLONE acetate (PRED FORTE) 1 % ophthalmic suspension Place 1 drop into the right eye 2 (two) times daily.     sildenafil (VIAGRA) 100 MG tablet Take 0.5-1 tablets (50-100 mg total) by mouth daily as needed for erectile dysfunction. 10 tablet 1   No current facility-administered medications on file prior to visit.    The following portions of the patient's history were reviewed and updated as appropriate: allergies, current medications, past family history, past medical history, past social history, past surgical history and problem list.  ROS Otherwise as in subjective above    Objective: BP 130/84   Pulse 69   Temp (!) 97.4 F (36.3 C)   Resp 16   Wt 209 lb (94.8 kg)   SpO2 97%   BMI 32.73 kg/m   General appearance: alert, no distress, well developed, well nourished Neck: supple, no lymphadenopathy, no thyromegaly, no masses Heart: RRR, normal S1, S2, no murmurs Lungs: CTA bilaterally, no wheezes, rhonchi, or rales Abdomen: +bs, soft, non tender, non distended, no masses, no hepatomegaly, no  splenomegaly GU: no obvious lymphadenopathy, normal GU, no mass, no hernia Pulses: 2+ radial pulses, 2+ pedal pulses, normal cap refill Left lower leg lateral with mild tendnerss, otherwise legs unremarkable   Venous ultrasound 07/30/2023 shows DVT left lower extremity in the tibioperoneal trunk.  Right extremity normal.  There was also presence of enlarged lymph nodes in the groin    Assessment: Encounter Diagnoses  Name Primary?   Acute deep vein thrombosis (DVT) of left peroneal vein (HCC) Yes   Lymphadenopathy    BMI 34.0-34.9,adult    Screen for colon cancer     Essential hypertension, benign    Elevated uric acid in blood      Plan: I reviewed his recent emergency department visits  We discussed diagnosis of DVT, possible complications, treatment recommendations.  I advised that he would need to continue on [name] [name] for 4 to 6 months.  We discussed timeframe for clot changes.  We discussed resting for the next 7 days then can use some walking in general moving around but no strenuous activity or heavy lifting for the next month.  Continue compression hose that he just darted wearing.  We discussed possible causes or triggers that would lead to DVT.  After discussion that he has been taking an over-the-counter supplement that may mimic testosterone.  He will discontinue this.  Given findings of lymphadenopathy on ultrasound of the leg we will pursue CT abdomen pelvis to evaluate for any occult malignancy.  Referral to cardiology has request given murmur mention at the hospital, family history of heart disease and new-onset of hypertension.  Hypertension-continue amlodipine 2.5 mg daily that was just recently started at the emergency department  Screen for colon cancer-he is past due for colonoscopy and is declined to do this in the past.  He is agreeable today to referral  [name] [name] was seen today for hospitalization follow-up.  Diagnoses and all orders for this visit:  Acute deep vein thrombosis (DVT) of left peroneal vein (HCC)  Lymphadenopathy -     CT ABDOMEN PELVIS W CONTRAST; Future  BMI 34.0-34.9,adult -     CT ABDOMEN PELVIS W CONTRAST; Future  Screen for colon cancer -     Ambulatory referral to Gastroenterology  Essential hypertension, benign -     Ambulatory referral to Cardiology  Elevated uric acid in blood    Follow up: pending referral
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
## 2023-08-16 ENCOUNTER — Encounter: Payer: Self-pay | Admitting: Cardiology

## 2023-08-16 ENCOUNTER — Ambulatory Visit: Attending: Cardiology | Admitting: Cardiology

## 2023-08-16 VITALS — BP 120/92 | HR 57 | Ht 67.0 in | Wt 211.0 lb

## 2023-08-16 DIAGNOSIS — G4733 Obstructive sleep apnea (adult) (pediatric): Secondary | ICD-10-CM | POA: Diagnosis not present

## 2023-08-16 DIAGNOSIS — I10 Essential (primary) hypertension: Secondary | ICD-10-CM

## 2023-08-16 DIAGNOSIS — R9431 Abnormal electrocardiogram [ECG] [EKG]: Secondary | ICD-10-CM | POA: Diagnosis not present

## 2023-08-16 NOTE — Patient Instructions (Signed)
 Medication Instructions:  No changes.  *If you need a refill on your cardiac medications before your next appointment, please call your pharmacy*  Testing/Procedures:  Your physician has requested that you have an echocardiogram. Echocardiography is a painless test that uses sound waves to create images of your heart. It provides your doctor with information about the size and shape of your heart and how well your heart's chambers and valves are working. This procedure takes approximately one hour. There are no restrictions for this procedure. Please do NOT wear cologne, perfume, aftershave, or lotions (deodorant is allowed). Please arrive 15 minutes prior to your appointment time.  Please note: We ask at that you not bring children with you during ultrasound (echo/ vascular) testing. Due to room size and safety concerns, children are not allowed in the ultrasound rooms during exams. Our front office staff cannot provide observation of children in our lobby area while testing is being conducted. An adult accompanying a patient to their appointment will only be allowed in the ultrasound room at the discretion of the ultrasound technician under special circumstances. We apologize for any inconvenience.    Your physician has recommended that you have a sleep study. This test records several body functions during sleep, including: brain activity, eye movement, oxygen and carbon dioxide blood levels, heart rate and rhythm, breathing rate and rhythm, the flow of air through your mouth and nose, snoring, body muscle movements, and chest and belly movement.   Follow-Up: At [facility], you and your health needs are our priority.  As part of our continuing mission to provide you with exceptional heart care, our providers are all part of one team.  This team includes your primary Cardiologist (physician) and Advanced Practice Providers or APPs (Physician Assistants and Nurse Practitioners) who all  work together to provide you with the care you need, when you need it.  Your next appointment:   3 month(s)  Provider:   Rollene Rotunda, MD     We recommend signing up for the patient portal called "MyChart".  Sign up information is provided on this After Visit Summary.  MyChart is used to connect with patients for Virtual Visits (Telemedicine).  Patients are able to view lab/test results, encounter notes, upcoming appointments, etc.  Non-urgent messages can be sent to your provider as well.   To learn more about what you can do with MyChart, go to [email or web address].   Other Instructions       1st Floor: - Lobby - Registration  - Pharmacy  - Lab - Cafe  2nd Floor: - PV Lab - Diagnostic Testing (echo, CT, nuclear med)  3rd Floor: - Vacant  4th Floor: - TCTS (cardiothoracic surgery) - AFib Clinic - Structural Heart Clinic - Vascular Surgery  - Vascular Ultrasound  5th Floor: - HeartCare Cardiology (general and EP) - Clinical Pharmacy for coumadin, hypertension, lipid, weight-loss medications, and med management appointments    Valet parking services will be available as well.

## 2023-08-24 ENCOUNTER — Telehealth: Payer: Self-pay

## 2023-08-24 NOTE — Telephone Encounter (Signed)
 4/10 PA is pending per [facility] Portal: [id number]

## 2023-08-25 ENCOUNTER — Ambulatory Visit
Admission: RE | Admit: 2023-08-25 | Discharge: 2023-08-25 | Disposition: A | Discharge status: Home / Self Care | Source: Ambulatory Visit | Attending: Medical | Admitting: Medical

## 2023-08-25 DIAGNOSIS — Z6834 Body mass index (BMI) 34.0-34.9, adult: Secondary | ICD-10-CM

## 2023-08-25 DIAGNOSIS — R591 Generalized enlarged lymph nodes: Secondary | ICD-10-CM

## 2023-08-25 MED ORDER — IOPAMIDOL (ISOVUE-300) INJECTION 61%
100.0000 mL | Freq: Once | INTRAVENOUS | Status: AC | PRN
  Administered 2023-08-25: 100 mL via INTRAVENOUS

## 2023-09-06 NOTE — Progress Notes (Signed)
 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  It is recommended to have a specific adrenal MRI to further evaluate this.  I recommend moving forward with the MRI.  You may end up needing a biopsy of the left do the MRI step recommended next if agreeable.  Let me know if you agree to the MRI.    There was also a hiatal hernia.    Sometimes these cause issues with indigestion or acid reflux but not always.

## 2023-09-07 ENCOUNTER — Other Ambulatory Visit: Payer: Self-pay | Admitting: Medical

## 2023-09-07 DIAGNOSIS — E278 Other specified disorders of adrenal gland: Secondary | ICD-10-CM

## 2023-09-12 NOTE — Addendum Note (Signed)
 Addended by: Charliene Conte A on: 09/12/2023 11:36 AM   Modules accepted: Orders

## 2023-09-14 ENCOUNTER — Encounter (HOSPITAL_COMMUNITY): Payer: Self-pay

## 2023-09-14 ENCOUNTER — Ambulatory Visit: Payer: Self-pay

## 2023-09-14 ENCOUNTER — Emergency Department (HOSPITAL_COMMUNITY)
Admission: EM | Admit: 2023-09-14 | Discharge: 2023-09-14 | Disposition: A | Discharge status: Home / Self Care | Attending: Emergency Medicine | Admitting: Emergency Medicine

## 2023-09-14 ENCOUNTER — Other Ambulatory Visit (HOSPITAL_COMMUNITY)

## 2023-09-14 ENCOUNTER — Other Ambulatory Visit: Payer: Self-pay

## 2023-09-14 ENCOUNTER — Emergency Department (HOSPITAL_COMMUNITY)

## 2023-09-14 ENCOUNTER — Other Ambulatory Visit: Payer: Self-pay | Admitting: Medical

## 2023-09-14 DIAGNOSIS — R42 Dizziness and giddiness: Secondary | ICD-10-CM | POA: Insufficient documentation

## 2023-09-14 DIAGNOSIS — Z79899 Other long term (current) drug therapy: Secondary | ICD-10-CM | POA: Insufficient documentation

## 2023-09-14 DIAGNOSIS — Z7901 Long term (current) use of anticoagulants: Secondary | ICD-10-CM | POA: Diagnosis not present

## 2023-09-14 DIAGNOSIS — I10 Essential (primary) hypertension: Secondary | ICD-10-CM | POA: Insufficient documentation

## 2023-09-14 HISTORY — DX: Unspecified abdominal hernia without obstruction or gangrene: K46.9

## 2023-09-14 HISTORY — DX: Acute embolism and thrombosis of unspecified deep veins of unspecified lower extremity: I82.409

## 2023-09-14 HISTORY — DX: Pure hypercholesterolemia, unspecified: E78.00

## 2023-09-14 LAB — CBC
HCT: 36.3 % — ABNORMAL LOW (ref 39.0–52.0)
Hemoglobin: 11.9 g/dL — ABNORMAL LOW (ref 13.0–17.0)
MCH: 28.8 pg (ref 26.0–34.0)
MCHC: 32.8 g/dL (ref 30.0–36.0)
MCV: 87.9 fL (ref 80.0–100.0)
Platelets: 331 10*3/uL (ref 150–400)
RBC: 4.13 MIL/uL — ABNORMAL LOW (ref 4.22–5.81)
RDW: 11.9 % (ref 11.5–15.5)
WBC: 6.2 10*3/uL (ref 4.0–10.5)
nRBC: 0 % (ref 0.0–0.2)

## 2023-09-14 LAB — BASIC METABOLIC PANEL WITH GFR
Anion gap: 9 (ref 5–15)
BUN: 9 mg/dL (ref 6–20)
CO2: 23 mmol/L (ref 22–32)
Calcium: 9.4 mg/dL (ref 8.9–10.3)
Chloride: 105 mmol/L (ref 98–111)
Creatinine, Ser: 0.96 mg/dL (ref 0.61–1.24)
GFR, Estimated: >60 mL/min (ref >60)
Glucose, Bld: 103 mg/dL — ABNORMAL HIGH (ref 70–99)
Potassium: 4 mmol/L (ref 3.5–5.1)
Sodium: 137 mmol/L (ref 135–145)

## 2023-09-14 LAB — TROPONIN I (HIGH SENSITIVITY)
Troponin I (High Sensitivity): 3 ng/L (ref <18)
Troponin I (High Sensitivity): 3 ng/L (ref <18)

## 2023-09-14 MED ORDER — APIXABAN 5 MG PO TABS
5.0000 mg | ORAL_TABLET | Freq: Once | ORAL | Status: AC
  Administered 2023-09-14: 5 mg via ORAL
  Filled 2023-09-14: qty 1

## 2023-09-14 MED ORDER — IOHEXOL 350 MG/ML SOLN
75.0000 mL | Freq: Once | INTRAVENOUS | Status: AC | PRN
  Administered 2023-09-14: 75 mL via INTRAVENOUS

## 2023-09-14 NOTE — Discharge Instructions (Signed)
 You were seen in the ED today for an episode of lightheadedness.  There were no emergency causes of your symptoms today.  Please make sure to pick up your Eliquis  at your pharmacy and continue taking this medication.  Please follow-up with your PCP in 1 week or less for reevaluation.  Please return the ED for any emergency medical symptoms.

## 2023-09-14 NOTE — Telephone Encounter (Signed)
 Chief Complaint: Shortness of breath at night, lightheadedness today Symptoms: see above Frequency: acute Pertinent Negatives: Patient denies fainting, numbness/weakness in arms or legs Disposition: [] ED /[] Urgent Care (no appt availability in office) / [x] Appointment(In office/virtual)/ []  [facility]/ [] Home Care/ [] Refused Recommended Disposition /[] [facility]/ []  Follow-up with PCP Additional Notes: Patient called in stating he was looking to make appt with PCP to discuss shortness of breath he experiences at night. Patient also states he had an episode of lightheadedness that he experienced today, that was resolved after he ate a candy bar. Patient unable to answer full assessment questions due to very loud background noise at work. Patient appt made for earliest availability with PCP, and educated to get evaluated at [facility] immediately if lightheadedness persists. Patient also has requested immediate refill of eliquis .    Copied from CRM [id number]. Topic: Clinical - Red Word Triage >> Sep 14, 2023 12:56 PM Donald Frost wrote: Red Word that prompted transfer to Nurse Triage: The patient called in stating he has been lightheaded and had some shortness of breath. He has also been with his blood thinner for a few days. I will transfer him tim E2C2 NT Reason for Disposition  [1] MILD dizziness (e.g., walking normally) AND [2] has been evaluated by doctor (or NP/PA) for this  Answer Assessment - Initial Assessment Questions 1. DESCRIPTION: "Describe your dizziness."     Was feeling lightheaded 2. LIGHTHEADED: "Do you feel lightheaded?" (e.g., somewhat faint, woozy, weak upon standing)     N/a 3. VERTIGO: "Do you feel like either you or the room is spinning or tilting?" (i.e. vertigo)     N/a 4. SEVERITY: "How bad is it?"  "Do you feel like you are going to faint?" "Can you stand and walk?"   - MILD: Feels slightly dizzy, but walking normally.   - MODERATE: Feels unsteady when  walking, but not falling; interferes with normal activities (e.g., school, work).   - SEVERE: Unable to walk without falling, or requires assistance to walk without falling; feels like passing out now.      No longer occuring 5. ONSET:  "When did the dizziness begin?"     Today earlier but has resolved 6. AGGRAVATING FACTORS: "Does anything make it worse?" (e.g., standing, change in head position)     N/a 7. HEART RATE: "Can you tell me your heart rate?" "How many beats in 15 seconds?"  (Note: not all patients can do this)       N/a 8. CAUSE: "What do you think is causing the dizziness?"     Uncertain but felt better once ate candy bar 9. RECURRENT SYMPTOM: "Have you had dizziness before?" If Yes, ask: "When was the last time?" "What happened that time?"     N/a 10. OTHER SYMPTOMS: "Do you have any other symptoms?" (e.g., fever, chest pain, vomiting, diarrhea, bleeding)       No  Protocols used: Dizziness - Lightheadedness-A-AH

## 2023-09-14 NOTE — Telephone Encounter (Unsigned)
 Copied from CRM [id number]. Topic: Clinical - Medication Refill >> Sep 14, 2023 12:54 PM Donald Frost wrote: Most Recent Primary Care Visit:  Provider: Claudene Crystal  Department: [facility]  Visit Type: HOSPITAL FOLLOW UP  Date: 08/03/2023  Medication: APIXABAN  (ELIQUIS ) VTE STARTER PACK (10MG  AND 5MG )  Has the patient contacted their pharmacy? No   Is this the correct pharmacy for this prescription? Yes If no, delete pharmacy and type the correct one.  This is the patient's preferred pharmacy:  [id number] - [place] ([place]), [place] - [address] [place] ([place]) [place] [address] Phone: [phone] Fax: [phone]   Has the prescription been filled recently? No  Is the patient out of the medication? No  Has the patient been seen for an appointment in the last year OR does the patient have an upcoming appointment? Yes  Can we respond through MyChart? Yes  Please assist patient further

## 2023-09-14 NOTE — ED Triage Notes (Signed)
 Pt c/o feeling lightheaded  and fatigued since this am around 1030; endorses some orthopnea; denies known sick contacts, denies cp, fevers, N/V, cough, pain; recently diagnosed with dvt, was on eliquis  for 8 days, states has not been able to get prescription refilled; denies leg pain/swelling  Verbal consent given to [facility]

## 2023-09-14 NOTE — ED Provider Notes (Signed)
 [place] EMERGENCY DEPARTMENT AT [facility] Provider Note   CSN: [id number] Arrival date & time: 09/14/23  1417     History  No chief complaint on file.   [name] [name] is a 58 y.o. 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  Patient states that he woke up feeling like his usual self, was at work and felt lightheaded and fatigued while walking around.  He states his symptoms improved after eating a chocolate bar, and has not had any recurrence of symptoms since 11 AM.  He denies any cough, congestion, runny nose.  Denies any fevers.  Denies any chest pain, nausea, or vomiting.  Denies any abdominal pain or back pain.  Denies any palpitations.  Of note, he has not taken his Eliquis  for about 1 week since running out of his prescription.  HPI     Home Medications Prior to Admission medications   Medication Sig Start Date End Date Taking? Authorizing Provider  allopurinol  (ZYLOPRIM ) 100 MG tablet Take 1 tablet (100 mg total) by mouth daily. 07/30/23   Arn Lane, MD  amLODipine  (NORVASC ) 2.5 MG tablet Take 1 tablet (2.5 mg total) by mouth daily. 07/30/23   Jerilynn Montenegro, MD  APIXABAN  (ELIQUIS ) VTE STARTER PACK (10MG  AND 5MG ) Take as directed on package: start with two-5mg  tablets twice daily for 7 days. On day 8, switch to one-5mg  tablet twice daily. 07/30/23   Jerilynn Montenegro, MD  brimonidine (ALPHAGAN) 0.15 % ophthalmic solution Place 1 drop into both eyes 2 (two) times daily.    [provider]  dorzolamide-timolol (COSOPT) 22.3-6.8 MG/ML ophthalmic solution Place 1 drop into both eyes 2 (two) times daily.    [provider]  fluticasone  (FLONASE ) 50 MCG/ACT nasal spray Place 2 sprays into both nostrils daily. 09/20/22   Tysinger, Christiane Cowing, PA-C  Multiple Vitamin (MULTIVITAMIN) capsule Take 1 capsule by mouth daily.    [provider]  Netarsudil Dimesylate 0.02 % SOLN  Apply 1 drop to eye daily.    [provider]  prednisoLONE acetate (PRED FORTE) 1 % ophthalmic suspension Place 1 drop into the right eye 2 (two) times daily. 06/08/22   [provider]  sildenafil  (VIAGRA ) 100 MG tablet Take 0.5-1 tablets (50-100 mg total) by mouth daily as needed for erectile dysfunction. 11/23/22   Tysinger, Christiane Cowing, PA-C      Allergies    Patient has no known allergies.    Review of Systems   Review of Systems  Physical Exam Updated Vital Signs BP (!) 140/79 (BP Location: Left Arm)   Pulse 87   Temp (!) 96.9 F (36.1 C) (Oral)   Resp 18   SpO2 100%  Physical Exam Vitals and nursing note reviewed.  Constitutional:      General: He is not in acute distress.    Appearance: He is well-developed.  HENT:     Head: Normocephalic and atraumatic.     Right Ear: External ear normal.     Left Ear: External ear normal.     Nose: Nose normal.     Mouth/Throat:     Mouth: Mucous membranes are moist.  Eyes:     Extraocular Movements: Extraocular movements intact.     Conjunctiva/sclera: Conjunctivae normal.  Cardiovascular:     Rate and Rhythm: Normal rate and regular rhythm.     Heart sounds: No murmur heard. Pulmonary:     Effort: Pulmonary effort is  normal. No respiratory distress.     Breath sounds: Normal breath sounds.  Abdominal:     Palpations: Abdomen is soft.     Tenderness: There is no abdominal tenderness. There is no guarding or rebound.  Musculoskeletal:     Cervical back: Neck supple.  Skin:    General: Skin is warm and dry.     Capillary Refill: Capillary refill takes less than 2 seconds.  Neurological:     General: No focal deficit present.     Mental Status: He is alert.     Cranial Nerves: Cranial nerves 2-12 are intact. No cranial nerve deficit or dysarthria.     Sensory: Sensation is intact.     Motor: Motor function is intact. No weakness, atrophy or abnormal muscle tone.     Comments: Leftward beating nystagmus,  patient's baseline  Psychiatric:        Mood and Affect: Mood normal.     ED Results / Procedures / Treatments   Labs (all labs ordered are listed, but only abnormal results are displayed) Labs Reviewed  CBC - Abnormal; Notable for the following components:      Result Value   RBC 4.13 (*)    Hemoglobin 11.9 (*)    HCT 36.3 (*)    All other components within normal limits  BASIC METABOLIC PANEL WITH GFR - Abnormal; Notable for the following components:   Glucose, Bld 103 (*)    All other components within normal limits  TROPONIN I (HIGH SENSITIVITY)  TROPONIN I (HIGH SENSITIVITY)    EKG EKG Interpretation Date/Time:  Thursday Sep 14 2023 14:30:19 EDT Ventricular Rate:  74 PR Interval:  170 QRS Duration:  84 QT Interval:  366 QTC Calculation: 406 R Axis:   18  Text Interpretation: Normal sinus rhythm Low voltage QRS Cannot rule out Anteroseptal infarct , age undetermined Abnormal ECG Confirmed by Hiawatha Lout ([id number]) on 09/14/2023 8:06:52 PM  Radiology CT Angio Chest PE W and/or Wo Contrast Result Date: 09/14/2023 CLINICAL DATA:  Lightheadedness, short of breath EXAM: CT ANGIOGRAPHY CHEST WITH CONTRAST TECHNIQUE: Multidetector CT imaging of the chest was performed using the standard protocol during bolus administration of intravenous contrast. Multiplanar CT image reconstructions and MIPs were obtained to evaluate the vascular anatomy. RADIATION DOSE REDUCTION: This exam was performed according to the departmental dose-optimization program which includes automated exposure control, adjustment of the mA and/or kV according to patient size and/or use of iterative reconstruction technique. CONTRAST:  75mL OMNIPAQUE  IOHEXOL  350 MG/ML SOLN COMPARISON:  09/14/2023 FINDINGS: Cardiovascular: This is a technically adequate evaluation of the pulmonary vasculature. No filling defects or pulmonary emboli. Borderline cardiomegaly. No pericardial effusion. Normal caliber of the thoracic  aorta. Atherosclerosis of the aorta and coronary vasculature. Mediastinum/Nodes: No enlarged mediastinal, hilar, or axillary lymph nodes. Thyroid gland, trachea, and esophagus demonstrate no significant findings. Small hiatal hernia. Lungs/Pleura: There is elevation of the right hemidiaphragm, with atelectasis of the right middle lobe. No acute airspace disease, effusion, or pneumothorax. The central airways are patent. Upper Abdomen: No acute abnormality. Musculoskeletal: No acute or destructive bony abnormalities. Reconstructed images demonstrate no additional findings. Review of the MIP images confirms the above findings. IMPRESSION: 1. No evidence of pulmonary embolus. 2. Borderline cardiomegaly. 3. Aortic Atherosclerosis (ICD10-I70.0). Coronary artery atherosclerosis. 4. Small hiatal hernia. Electronically Signed   By: Bobbye Burrow [name]   On: 09/14/2023 21:43   DG Chest 2 View Result Date: 09/14/2023 CLINICAL DATA:  Shortness of breath. EXAM: CHEST - 2  VIEW COMPARISON:  07/29/2015. FINDINGS: There are compressive atelectatic changes at the right lung base. Bilateral lung fields are otherwise clear. Bilateral costophrenic angles are clear. Note is made of elevated right hemidiaphragm. Normal cardio-mediastinal silhouette. No acute osseous abnormalities. The soft tissues are within normal limits. IMPRESSION: No active cardiopulmonary disease. Electronically Signed   By: Beula Brunswick [name]   On: 09/14/2023 15:02    Procedures Procedures    Medications Ordered in ED Medications  iohexol  (OMNIPAQUE ) 350 MG/ML injection 75 mL (75 mLs Intravenous Contrast Given 09/14/23 2135)  apixaban  (ELIQUIS ) tablet 5 mg (5 mg Oral Given 09/14/23 2208)    ED Course/ Medical Decision Making/ A&P                                 Medical Decision Making Amount and/or Complexity of Data Reviewed Labs: ordered. Radiology: ordered.  Risk Prescription drug management.   Patient is alert, afebrile, and  hemodynamically stable in no acute distress.  Physical exam as noted above.  Differential includes hypoglycemia, dehydration, AKI, PE, pneumonia, ACS, dysrhythmia, amongst other diagnoses.  Given no abdominal or back pain as well as stable vital signs and resolution of symptoms, low concern for emergent pathology such as AAA.  Given transient symptoms with baseline neurologic exam, doubt central etiology to patient's dizziness.  No severe headache to suggest SAH.  Labs and troponin were obtained through triage, will also obtain CTA PE study.  CBC resulted with no leukocytosis, hemoglobin 11.9, near patient's baseline.  BMP with no severe electrolyte derangements, no AKI.  1st and 2nd troponin stable at 3.  I personally interpreted patient's EKG, which demonstrated sinus rhythm with rate of 74, normal PR interval, QRS duration, and QTc.  No obvious arrhythmias, no acute ischemic changes, EKG similar to prior in system.  Patient's chest x-rays with no acute findings per radiology.  CT PE study with no thromboembolic disease.  Overall, patient's workup is reassuring at this time.  Spoke with patient and wife about reassuring workup.  Discussed follow-up with PCP in the next few days.  Patient plans to pick up his Eliquis  tomorrow, was given 1 dose in the ED in the interim.  Discussed strict return precautions.  Patient was discharged in stable condition.  Patient seen in conjunction with Dr. Isaiah Marc, who agreed with the above work-up and plan of care.        Final Clinical Impression(s) / ED Diagnoses Final diagnoses:  Lightheadedness    Rx / DC Orders ED Discharge Orders     None         Lorain Robson, MD 09/14/23 2324    Mordecai Applebaum, MD 09/15/23 1222

## 2023-09-18 ENCOUNTER — Encounter: Payer: Self-pay | Admitting: Medical

## 2023-09-18 ENCOUNTER — Ambulatory Visit (HOSPITAL_COMMUNITY): Attending: Cardiovascular Disease

## 2023-09-18 ENCOUNTER — Ambulatory Visit: Admitting: Medical

## 2023-09-18 VITALS — BP 138/92 | HR 59 | Temp 97.7°F | Wt 219.4 lb

## 2023-09-18 DIAGNOSIS — I700 Atherosclerosis of aorta: Secondary | ICD-10-CM | POA: Diagnosis not present

## 2023-09-18 DIAGNOSIS — R9431 Abnormal electrocardiogram [ECG] [EKG]: Secondary | ICD-10-CM | POA: Diagnosis present

## 2023-09-18 DIAGNOSIS — I10 Essential (primary) hypertension: Secondary | ICD-10-CM

## 2023-09-18 DIAGNOSIS — I2510 Atherosclerotic heart disease of native coronary artery without angina pectoris: Secondary | ICD-10-CM

## 2023-09-18 DIAGNOSIS — Z86718 Personal history of other venous thrombosis and embolism: Secondary | ICD-10-CM

## 2023-09-18 DIAGNOSIS — R42 Dizziness and giddiness: Secondary | ICD-10-CM | POA: Diagnosis not present

## 2023-09-18 DIAGNOSIS — E790 Hyperuricemia without signs of inflammatory arthritis and tophaceous disease: Secondary | ICD-10-CM | POA: Insufficient documentation

## 2023-09-18 LAB — ECHOCARDIOGRAM COMPLETE
Area-P 1/2: 3.3 cm2
S' Lateral: 2.99 cm
Weight: 3510.4 [oz_av]

## 2023-09-18 MED ORDER — ALLOPURINOL 100 MG PO TABS: 100.0000 mg | ORAL_TABLET | Freq: Every day | ORAL | 1 refills | Status: DC

## 2023-09-18 MED ORDER — APIXABAN (ELIQUIS) VTE STARTER PACK (10MG AND 5MG): ORAL_TABLET | ORAL | 0 refills | Status: DC

## 2023-09-18 MED ORDER — ROSUVASTATIN CALCIUM 20 MG PO TABS: 20.0000 mg | ORAL_TABLET | Freq: Every day | ORAL | 3 refills | Status: AC

## 2023-09-18 NOTE — Progress Notes (Signed)
 Subjective:  [name] [name] is a 58 y.o. male who presents for Chief Complaint  Patient presents with   Hospitalization Follow-up    Hospital follow-up from 09/14/23 on SOB and Lighheadedness     Here for hospital emergency dept follow up.  Here with wife today.    Went to the ED on 09/14/23 as he awoke lightheaded that morning . Had evaluation, no specific worrisome thing found.  Was discharged and told to followup with PCP.  Feels fine today, no more lightheadedness.  He is compliant with amlodipine  2.5 mg daily.  He has a blood pressure cuff at home but has not been checking his blood pressure.  After recent hospital visit last week he had a CT chest and chest x-ray.  He is scheduled to have an echo audiogram today per cardiology  He established recent with cardiology after being initiated on blood pressure medicine in March 2025  He recently ran out of his Eliquis  about a week ago  No other new symptoms today.   Past Medical History:  Diagnosis Date   Allergy    DVT (deep venous thrombosis) (HCC)    Erectile dysfunction    GERD (gastroesophageal reflux disease)    Glaucoma    Hernia, abdominal    High cholesterol    Hypertension    Retinal detachment    Current Outpatient Medications on File Prior to Visit  Medication Sig Dispense Refill   amLODipine  (NORVASC ) 2.5 MG tablet Take 1 tablet (2.5 mg total) by mouth daily. 30 tablet 1   brimonidine (ALPHAGAN) 0.15 % ophthalmic solution Place 1 drop into both eyes 2 (two) times daily.     dorzolamide-timolol (COSOPT) 22.3-6.8 MG/ML ophthalmic solution Place 1 drop into both eyes 2 (two) times daily.     Netarsudil Dimesylate 0.02 % SOLN Apply 1 drop to eye daily.     prednisoLONE acetate (PRED FORTE) 1 % ophthalmic suspension Place 1 drop into the right eye 2 (two) times daily.     sildenafil  (VIAGRA ) 100 MG tablet Take 0.5-1 tablets (50-100 mg total) by mouth daily as needed for erectile dysfunction. 10 tablet 1   No current  facility-administered medications on file prior to visit.    The following portions of the patient's history were reviewed and updated as appropriate: allergies, current medications, past family history, past medical history, past social history, past surgical history and problem list.  ROS Otherwise as in subjective above    Objective: BP (!) 138/92 (Cuff Size: Large)   Pulse (!) 59   Temp 97.7 F (36.5 C)   Wt 219 lb 6.4 oz (99.5 kg)   BMI 34.36 kg/m   BP Readings from Last 3 Encounters:  09/18/23 (!) 138/92  09/14/23 (!) 140/79  08/16/23 (!) 120/92   Wt Readings from Last 3 Encounters:  09/18/23 219 lb 6.4 oz (99.5 kg)  08/16/23 211 lb (95.7 kg)  08/03/23 209 lb (94.8 kg)    General appearance: alert, no distress, well developed, well nourished HEENT: normocephalic, sclerae anicteric, conjunctiva pink and moist, TMs pearly, nares patent, no discharge or erythema, pharynx normal Oral cavity: MMM, no lesions Neck: supple, no lymphadenopathy, no thyromegaly, no masses, no JVD or bruits Heart: RRR, normal S1, S2, no murmurs Lungs: CTA bilaterally, no wheezes, rhonchi, or rales Pulses: 2+ radial pulses, 2+ pedal pulses, normal cap refill Ext: no edema   Assessment: Encounter Diagnoses  Name Primary?   Essential hypertension, benign Yes   Coronary artery disease involving native coronary  artery of native heart without angina pectoris    Aortic atherosclerosis (HCC)    Dizziness    Elevated uric acid in blood    History of DVT (deep vein thrombosis)      Plan: Hypertension-not at goal.  He is on low-dose amlodipine  2.5 mg daily.  He had a CT scan this past week of his chest that shows some cardiomegaly.  I will reach out to cardiology to see if they want to increase to 5 mg amlodipine  or add or change to ARB  Coronary artery disease and aortic atherosclerosis noted on recent CT scan of chest last week-discussed the findings and significance.  Begin statin.   Discussed risk and benefits and proper use of medication.  Plan fasting follow-up in 8 weeks.  Dizziness has resolved from his emergency department visit last week  Elevated uric acid in blood-we will continue allopurinol  100 mg for now.  Consider increasing dose in the future if uric acid is staying over 7  History of DVT, initiated Eliquis  in March 2025.  He ran out of medication about a week ago and we were not notified by pharmacy or him for request.  Refilled medication today.  Advised he call back before he runs out of this starter pack  Follow-up as planned with cardiology today for echocardiogram  Tarrel was seen today for hospitalization follow-up.  Diagnoses and all orders for this visit:  Essential hypertension, benign  Coronary artery disease involving native coronary artery of native heart without angina pectoris  Aortic atherosclerosis (HCC)  Dizziness  Elevated uric acid in blood  History of DVT (deep vein thrombosis)  Other orders -     APIXABAN  (ELIQUIS ) VTE STARTER PACK (10MG  AND 5MG ); Take as directed on package: start with two-5mg  tablets twice daily for 7 days. On day 8, switch to one-5mg  tablet twice daily. -     allopurinol  (ZYLOPRIM ) 100 MG tablet; Take 1 tablet (100 mg total) by mouth daily. -     rosuvastatin (CRESTOR) 20 MG tablet; Take 1 tablet (20 mg total) by mouth daily.    Follow up: pending echo today, f/u 6-8 weeks fasting

## 2023-09-19 ENCOUNTER — Ambulatory Visit: Admitting: Medical

## 2023-09-21 ENCOUNTER — Ambulatory Visit (HOSPITAL_COMMUNITY)
Admission: RE | Admit: 2023-09-21 | Discharge: 2023-09-21 | Disposition: A | Discharge status: Home / Self Care | Source: Ambulatory Visit | Attending: Medical | Admitting: Medical

## 2023-09-21 ENCOUNTER — Telehealth: Payer: Self-pay

## 2023-09-21 DIAGNOSIS — E278 Other specified disorders of adrenal gland: Secondary | ICD-10-CM | POA: Diagnosis present

## 2023-09-21 DIAGNOSIS — R9431 Abnormal electrocardiogram [ECG] [EKG]: Secondary | ICD-10-CM

## 2023-09-21 MED ORDER — GADOBUTROL 1 MMOL/ML IV SOLN
10.0000 mL | Freq: Once | INTRAVENOUS | Status: AC | PRN
  Administered 2023-09-21: 10 mL via INTRAVENOUS

## 2023-09-21 NOTE — Telephone Encounter (Signed)
-----   Message from Nurse Adriana Hopping R sent at 09/21/2023 11:40 AM EDT -----  ----- Message ----- From: Eilleen Grates, MD Sent: 09/19/2023   9:33 PM EDT To: [facility] Triage  Echo is unremarkable.  Please order a coronary calcium score for abnormal EKG.  Call Mr. Rivera with the results and send results to Tysinger, Christiane Cowing, PA-C

## 2023-09-21 NOTE — Telephone Encounter (Signed)
 Spoke with pt regarding his results. Pt agreeable to Coronary calcium score. Coronary calcium score ordered. Results sent to PCP. Pt verbalized understanding. All questions if any were answered.

## 2023-09-22 ENCOUNTER — Other Ambulatory Visit: Payer: Self-pay | Admitting: Medical

## 2023-09-22 MED ORDER — AMLODIPINE BESYLATE 5 MG PO TABS: 5.0000 mg | ORAL_TABLET | Freq: Every day | ORAL | 2 refills | Status: DC

## 2023-09-22 NOTE — Progress Notes (Signed)
 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    For whatever reason your pancreas is decreased in size compared to normal.  You have a moderate size hiatal hernia which is an enlarged opening where the esophagus goes through the diaphragm muscle.  Sometimes this has an impact on acid reflux and indigestion.  Your cardiologist has put a message and they want you to do a CT heart test.  So expect a phone call about that.  I am going to go ahead and increase your amlodipine  to 5 mg a day to help better control your blood pressure

## 2023-09-25 ENCOUNTER — Ambulatory Visit: Admitting: Gastroenterology

## 2023-11-03 ENCOUNTER — Encounter (HOSPITAL_COMMUNITY): Payer: Self-pay

## 2023-11-03 ENCOUNTER — Ambulatory Visit (HOSPITAL_COMMUNITY)
Admission: RE | Admit: 2023-11-03 | Discharge: 2023-11-03 | Disposition: A | Discharge status: Home / Self Care | Payer: Self-pay | Source: Ambulatory Visit | Attending: Cardiology | Admitting: Cardiology

## 2023-11-03 DIAGNOSIS — R9431 Abnormal electrocardiogram [ECG] [EKG]: Secondary | ICD-10-CM | POA: Insufficient documentation

## 2023-11-05 ENCOUNTER — Ambulatory Visit: Payer: Self-pay | Admitting: Cardiology

## 2023-11-05 DIAGNOSIS — R9431 Abnormal electrocardiogram [ECG] [EKG]: Secondary | ICD-10-CM

## 2023-11-13 ENCOUNTER — Other Ambulatory Visit: Payer: Self-pay | Admitting: Medical

## 2023-11-13 ENCOUNTER — Ambulatory Visit: Admitting: Medical

## 2023-11-13 VITALS — BP 130/84 | HR 66 | Ht 65.75 in | Wt 214.6 lb

## 2023-11-13 DIAGNOSIS — R7301 Impaired fasting glucose: Secondary | ICD-10-CM

## 2023-11-13 DIAGNOSIS — I10 Essential (primary) hypertension: Secondary | ICD-10-CM

## 2023-11-13 DIAGNOSIS — Z0000 Encounter for general adult medical examination without abnormal findings: Secondary | ICD-10-CM

## 2023-11-13 DIAGNOSIS — E790 Hyperuricemia without signs of inflammatory arthritis and tophaceous disease: Secondary | ICD-10-CM

## 2023-11-13 DIAGNOSIS — E785 Hyperlipidemia, unspecified: Secondary | ICD-10-CM

## 2023-11-13 DIAGNOSIS — Z86718 Personal history of other venous thrombosis and embolism: Secondary | ICD-10-CM

## 2023-11-13 DIAGNOSIS — I700 Atherosclerosis of aorta: Secondary | ICD-10-CM

## 2023-11-13 DIAGNOSIS — I2510 Atherosclerotic heart disease of native coronary artery without angina pectoris: Secondary | ICD-10-CM

## 2023-11-13 DIAGNOSIS — Z125 Encounter for screening for malignant neoplasm of prostate: Secondary | ICD-10-CM

## 2023-11-13 NOTE — Progress Notes (Signed)
 Subjective:  [name] [name] is a 58 y.o. male who presents for Chief Complaint  Patient presents with   Follow-up    Follow-up on last visit in May, no concerns     Here with his spouse for med check  Last visit in May we increased amlodipine  from 2.5 to 5 mg daily.  He is not checking blood pressures at home.  No issues reported.  Tolerating medicine just fine.  History of DVT diagnosed March 2025 that he continues on Eliquis  without complaint.  No bleeding, bruising or other symptoms of concern  Hyperlipidemia-he continues on rosuvastatin  Crestor  20 mg daily  Elevated uric acid-compliant with allopurinol  100 mg daily  No other aggravating or relieving factors.    No other c/o.  Past Medical History:  Diagnosis Date   Allergy    DVT (deep venous thrombosis) (HCC)    Erectile dysfunction    GERD (gastroesophageal reflux disease)    Glaucoma    Hernia, abdominal    High cholesterol    Hypertension    Retinal detachment    Current Outpatient Medications on File Prior to Visit  Medication Sig Dispense Refill   allopurinol  (ZYLOPRIM ) 100 MG tablet Take 1 tablet (100 mg total) by mouth daily. 90 tablet 1   amLODipine  (NORVASC ) 5 MG tablet Take 1 tablet (5 mg total) by mouth daily. 30 tablet 2   APIXABAN  (ELIQUIS ) VTE STARTER PACK (10MG  AND 5MG ) Take as directed on package: start with two-5mg  tablets twice daily for 7 days. On day 8, switch to one-5mg  tablet twice daily. 74 each 0   brimonidine (ALPHAGAN) 0.15 % ophthalmic solution Place 1 drop into both eyes 2 (two) times daily.     dorzolamide-timolol (COSOPT) 22.3-6.8 MG/ML ophthalmic solution Place 1 drop into both eyes 2 (two) times daily.     Netarsudil Dimesylate 0.02 % SOLN Apply 1 drop to eye daily.     prednisoLONE acetate (PRED FORTE) 1 % ophthalmic suspension Place 1 drop into the right eye 2 (two) times daily.     rosuvastatin  (CRESTOR ) 20 MG tablet Take 1 tablet (20 mg total) by mouth daily. 90 tablet 3    sildenafil  (VIAGRA ) 100 MG tablet Take 0.5-1 tablets (50-100 mg total) by mouth daily as needed for erectile dysfunction. 10 tablet 1   No current facility-administered medications on file prior to visit.     The following portions of the patient's history were reviewed and updated as appropriate: allergies, current medications, past family history, past medical history, past social history, past surgical history and problem list.  ROS Otherwise as in subjective above     Objective: BP 130/86   Pulse 66   Ht 5' 5.75 (1.67 m)   Wt 214 lb 9.6 oz (97.3 kg)   SpO2 98%   BMI 34.90 kg/m   BP Readings from Last 3 Encounters:  11/13/23 130/86  09/18/23 (!) 138/92  09/14/23 (!) 140/79   General appearance: alert, no distress, well developed, well nourished Heart: RRR, normal S1, S2, no murmurs Lungs: CTA bilaterally, no wheezes, rhonchi, or rales Pulses: 2+ radial pulses, 2+ pedal pulses, normal cap refill Ext:  no leg asymmetry or swelling   Assessment: Encounter Diagnoses  Name Primary?   Coronary artery disease involving native coronary artery of native heart without angina pectoris Yes   Aortic atherosclerosis (HCC)    Elevated uric acid in blood    Essential hypertension, benign    History of DVT (deep vein thrombosis)  Impaired fasting blood sugar      Plan: We discussed his recent CT coronary calcium  score that was abnormal.  He has atherosclerosis in the coronary arteries in the aorta.  Continue rosuvastatin  20 mg daily.  Counseled on diet, low-cholesterol diet.  Counseled on exercise.  He is due for physical as of July 02/2024.  Return on or after November 23, 2023 for fasting labs including lipid panel.  We may consider adding Repatha, Praluent or other agent to get LDL closer to 50  Uric acid elevated-continue allopurinol  100 mg daily.  Return soon for labs  Hypertension-continue amlodipine  5 mg daily.  Advised to check blood pressures at home.  Goal less than  130/80.  He will follow-up with cardiology soon.SABRA  History of DVT-continue Eliquis .  Given the atherosclerosis finding we may continue Eliquis  even after the 4 to 102-month initial recommendation from diagnosis of DVT from March 2025  Impaired glucose-return soon for labs  [name] [name] was seen today for follow-up.  Diagnoses and all orders for this visit:  Coronary artery disease involving native coronary artery of native heart without angina pectoris  Aortic atherosclerosis (HCC)  Elevated uric acid in blood  Essential hypertension, benign  History of DVT (deep vein thrombosis)  Impaired fasting blood sugar    Follow up: return after 11/23/23 for fasting labs, soon for well visit

## 2023-11-24 ENCOUNTER — Other Ambulatory Visit

## 2023-11-24 DIAGNOSIS — Z0000 Encounter for general adult medical examination without abnormal findings: Secondary | ICD-10-CM

## 2023-11-24 DIAGNOSIS — E785 Hyperlipidemia, unspecified: Secondary | ICD-10-CM

## 2023-11-24 DIAGNOSIS — Z125 Encounter for screening for malignant neoplasm of prostate: Secondary | ICD-10-CM

## 2023-11-24 DIAGNOSIS — I700 Atherosclerosis of aorta: Secondary | ICD-10-CM

## 2023-11-24 DIAGNOSIS — R7301 Impaired fasting glucose: Secondary | ICD-10-CM

## 2023-11-24 DIAGNOSIS — E790 Hyperuricemia without signs of inflammatory arthritis and tophaceous disease: Secondary | ICD-10-CM

## 2023-11-24 DIAGNOSIS — I2510 Atherosclerotic heart disease of native coronary artery without angina pectoris: Secondary | ICD-10-CM

## 2023-11-24 LAB — LIPID PANEL

## 2023-11-25 LAB — CBC WITH DIFFERENTIAL/PLATELET
Basophils Absolute: 0.1 x10E3/uL (ref 0.0–0.2)
Basos: 1 %
EOS (ABSOLUTE): 0.4 x10E3/uL (ref 0.0–0.4)
Eos: 7 %
Hematocrit: 38.3 % (ref 37.5–51.0)
Hemoglobin: 11.9 g/dL — ABNORMAL LOW (ref 13.0–17.7)
Immature Grans (Abs): 0 x10E3/uL (ref 0.0–0.1)
Immature Granulocytes: 0 %
Lymphocytes Absolute: 1.4 x10E3/uL (ref 0.7–3.1)
Lymphs: 26 %
MCH: 27.9 pg (ref 26.6–33.0)
MCHC: 31.1 g/dL — ABNORMAL LOW (ref 31.5–35.7)
MCV: 90 fL (ref 79–97)
Monocytes Absolute: 0.5 x10E3/uL (ref 0.1–0.9)
Monocytes: 8 %
Neutrophils Absolute: 3.1 x10E3/uL (ref 1.4–7.0)
Neutrophils: 58 %
Platelets: 313 x10E3/uL (ref 150–450)
RBC: 4.26 x10E6/uL (ref 4.14–5.80)
RDW: 12 % (ref 11.6–15.4)
WBC: 5.4 x10E3/uL (ref 3.4–10.8)

## 2023-11-25 LAB — COMPREHENSIVE METABOLIC PANEL WITH GFR
ALT: 19 IU/L (ref 0–44)
AST: 21 IU/L (ref 0–40)
Albumin: 4.3 g/dL (ref 3.8–4.9)
Alkaline Phosphatase: 46 IU/L (ref 44–121)
BUN/Creatinine Ratio: 9 (ref 9–20)
BUN: 9 mg/dL (ref 6–24)
Bilirubin Total: 0.6 mg/dL (ref 0.0–1.2)
CO2: 20 mmol/L (ref 20–29)
Calcium: 9.3 mg/dL (ref 8.7–10.2)
Chloride: 106 mmol/L (ref 96–106)
Creatinine, Ser: 0.96 mg/dL (ref 0.76–1.27)
Globulin, Total: 2.9 g/dL (ref 1.5–4.5)
Glucose: 125 mg/dL — AB (ref 70–99)
Potassium: 4.4 mmol/L (ref 3.5–5.2)
Sodium: 141 mmol/L (ref 134–144)
Total Protein: 7.2 g/dL (ref 6.0–8.5)
eGFR: 93 mL/min/1.73 (ref >59)

## 2023-11-25 LAB — LIPID PANEL
Cholesterol, Total: 114 mg/dL (ref 100–199)
HDL: 45 mg/dL (ref >39)
LDL CALC COMMENT:: 2.5 ratio (ref 0.0–5.0)
LDL Chol Calc (NIH): 57 mg/dL (ref 0–99)
Triglycerides: 49 mg/dL (ref 0–149)
VLDL Cholesterol Cal: 12 mg/dL (ref 5–40)

## 2023-11-25 LAB — PSA: Prostate Specific Ag, Serum: 1.3 ng/mL (ref 0.0–4.0)

## 2023-11-25 LAB — HEMOGLOBIN A1C
Est. average glucose Bld gHb Est-mCnc: 128 mg/dL
Hgb A1c MFr Bld: 6.1 % — ABNORMAL HIGH (ref 4.8–5.6)

## 2023-11-25 LAB — URIC ACID: Uric Acid: 5.4 mg/dL (ref 3.8–8.4)

## 2023-11-26 DIAGNOSIS — E785 Hyperlipidemia, unspecified: Secondary | ICD-10-CM | POA: Insufficient documentation

## 2023-11-26 NOTE — Progress Notes (Unsigned)
 Cardiology Office Note:   Date:  11/28/2023  ID:  [name] [name], DOB 06-13-1965, MRN [id number] PCP: [name]  [facility] Providers Cardiologist:  Lynwood Schilling, MD {  History of Present Illness:   [name] [name] is a 58 y.o. male  who presents for evaluation of hypertension.  He is referred by  Bulah Alm GORMAN, PA-C.  He also was noted to have an abnormal EKG.  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  Echo did not demonstrate any abnormality.  Echo demonstrated well-preserved ejection fraction.  There were no wall motion abnormalities.  ct demonstrated a coronary calcium  score of 1047 which was 99th percentile.  He also has sleep apnea.  His sleep study was ordered but he has not yet been called for this.  He presents today for follow-up.  He has some symptoms occasionally of some chest discomfort at night in bed.  His wife says she hears some gurgling and he might rub his chest.  He is not describing classic substernal chest discomfort, jaw or arm discomfort.  He was able to walk half mile with his kids the other day without bringing on any symptoms.  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  He is not describing any new symptoms with this.  He is not having any new palpitations, presyncope or syncope.  There might be occasional shortness of breath with activities.  He did have a DVT diagnosed in March and has been taking anticoagulation since then.  ROS: As stated in the HPI and negative for all other systems.  Studies Reviewed:    EKG:     NA  Risk Assessment/Calculations:      Physical Exam:   VS:  BP (!) 146/90 (BP Location: Right Arm, Patient Position: Sitting, Cuff Size: Normal)   Pulse 69   Ht 5' 7 (1.702 m)   Wt 217 lb (98.4 kg)   SpO2 95%   BMI 33.99 kg/m    Wt Readings from Last 3 Encounters:  11/28/23 217 lb (98.4 kg)  11/13/23 214 lb 9.6 oz (97.3 kg)   09/18/23 219 lb 6.4 oz (99.5 kg)     GEN: Well nourished, well developed in no acute distress NECK: No JVD; No carotid bruits CARDIAC: RRR, no murmurs, rubs, gallops RESPIRATORY:  Clear to auscultation without rales, wheezing or rhonchi  ABDOMEN: Soft, non-tender, non-distended EXTREMITIES:  No edema; No deformity   ASSESSMENT AND PLAN:   HTN: His blood pressure is mildly above target.  For now he will continue the meds as listed and keep a blood pressure diary.   Sleep apnea:   I am going to try again to get his sleep study scheduled.  STOP-BANG is 6.  He needs split night sleep study.   Elevated coronary calcium : He does have some symptoms and a worrisome coronary calcium  level.  I would prefer not to do invasive testing as I do not want him to come off of his Eliquis  this soon after a DVT.  I am going to order a coronary CTA.  Dyslipidemia: LDL is 137 but he was just started on the Crestor .  The goal should be an LDL in the 50s and I will check her lipid profile in a few months.   DVT: Continue Eliquis .     Follow up with an APP after the coronary CTA.  Signed, Lynwood Schilling, MD

## 2023-11-27 ENCOUNTER — Other Ambulatory Visit: Payer: Self-pay | Admitting: Internal Medicine

## 2023-11-27 ENCOUNTER — Ambulatory Visit: Payer: Self-pay | Admitting: Medical

## 2023-11-27 ENCOUNTER — Other Ambulatory Visit: Payer: Self-pay | Admitting: Medical

## 2023-11-27 DIAGNOSIS — D649 Anemia, unspecified: Secondary | ICD-10-CM

## 2023-11-27 DIAGNOSIS — Z1211 Encounter for screening for malignant neoplasm of colon: Secondary | ICD-10-CM

## 2023-11-27 MED ORDER — APIXABAN 5 MG PO TABS: 5.0000 mg | ORAL_TABLET | Freq: Two times a day (BID) | ORAL | 1 refills | Status: DC

## 2023-11-27 MED ORDER — AMLODIPINE BESYLATE 5 MG PO TABS: 5.0000 mg | ORAL_TABLET | Freq: Every day | ORAL | 2 refills | Status: AC

## 2023-11-27 NOTE — Progress Notes (Signed)
 Diabetes marker 6.1% stable.  Blood sugar elevated, so you are prediabetic   hemoglobin showing a little bit of anemia.    Kidney liver and electrolytes normal.  Uric acid normal.  Prostate marker normal.  Cholesterol numbers much improved on Crestor   You need to have a colonoscopy for screening and given the anemia.  If no current GI doctor, I recommend referral to gastro for colon cancer screening and anemia evaluation  Continue your current medications.

## 2023-11-28 ENCOUNTER — Encounter: Payer: Self-pay | Admitting: Cardiology

## 2023-11-28 ENCOUNTER — Ambulatory Visit: Attending: Cardiology | Admitting: Cardiology

## 2023-11-28 VITALS — BP 146/90 | HR 69 | Ht 67.0 in | Wt 217.0 lb

## 2023-11-28 DIAGNOSIS — I10 Essential (primary) hypertension: Secondary | ICD-10-CM

## 2023-11-28 DIAGNOSIS — Z01812 Encounter for preprocedural laboratory examination: Secondary | ICD-10-CM

## 2023-11-28 DIAGNOSIS — E785 Hyperlipidemia, unspecified: Secondary | ICD-10-CM | POA: Diagnosis not present

## 2023-11-28 DIAGNOSIS — R9431 Abnormal electrocardiogram [ECG] [EKG]: Secondary | ICD-10-CM | POA: Diagnosis not present

## 2023-11-28 DIAGNOSIS — G4730 Sleep apnea, unspecified: Secondary | ICD-10-CM

## 2023-11-28 DIAGNOSIS — R072 Precordial pain: Secondary | ICD-10-CM

## 2023-11-28 MED ORDER — METOPROLOL TARTRATE 100 MG PO TABS: 100.0000 mg | ORAL_TABLET | Freq: Once | ORAL | 0 refills | Status: DC

## 2023-11-28 NOTE — Patient Instructions (Signed)
 Medication Instructions:  Your physician recommends that you continue on your current medications as directed. Please refer to the Current Medication list given to you today.  *If you need a refill on your cardiac medications before your next appointment, please call your pharmacy*  Lab Work: BMET prior to CTA If you have labs (blood work) drawn today and your tests are completely normal, you will receive your results only by: MyChart Message (if you have MyChart) OR A paper copy in the mail If you have any lab test that is abnormal or we need to change your treatment, we will call you to review the results.  Testing/Procedures: Coronary CTA Your physician has requested that you have cardiac CT. Cardiac computed tomography (CT) is a painless test that uses an x-ray machine to take clear, detailed pictures of your heart. For further information please visit [email or web address] Please follow instruction sheet as given.    Follow-Up: At [facility], you and your health needs are our priority.  As part of our continuing mission to provide you with exceptional heart care, our providers are all part of one team.  This team includes your primary Cardiologist (physician) and Advanced Practice Providers or APPs (Physician Assistants and Nurse Practitioners) who all work together to provide you with the care you need, when you need it.  Your next appointment:   After CTA  Provider:   APP  We recommend signing up for the patient portal called MyChart.  Sign up information is provided on this After Visit Summary.  MyChart is used to connect with patients for Virtual Visits (Telemedicine).  Patients are able to view lab/test results, encounter notes, upcoming appointments, etc.  Non-urgent messages can be sent to your provider as well.   To learn more about what you can do with MyChart, go to [email or web address].   Other Instructions   Your cardiac CT will be scheduled at one of  the below locations:   [facility] [address]  [place], [place] [address]  If scheduled at [facility], please arrive at the [facility] and [facility] Entrance (Entrance C2) of [facility] 30 minutes prior to test start time. You can use the FREE valet parking offered at entrance C (encouraged to control the heart rate for the test)  Proceed to the [facility] Radiology Department (first floor) to check-in and test prep.   All radiology patients and guests should use entrance C2 at [facility], accessed from [facility], even though the hospital's physical address listed is [address]    If scheduled at the Heart and Vascular Tower at [address], please enter the parking lot using the [address] entrance and use the FREE valet service at the patient drop-off area. Enter the buidling and check-in with registration on the main floor.  Please follow these instructions carefully (unless otherwise directed):  An IV will be required for this test and Nitroglycerin  will be given.  Hold all erectile dysfunction medications at least 3 days (72 hrs) prior to test. (Ie viagra , cialis, sildenafil , tadalafil, etc)   On the Night Before the Test: Be sure to Drink plenty of water. Do not consume any caffeinated/decaffeinated beverages or chocolate 12 hours prior to your test. Do not take any antihistamines 12 hours prior to your test.   On the Day of the Test: Drink plenty of water until 1 hour prior to the test. Do not eat any food 1 hour  prior to test. You may take your regular medications prior to the test.  Take metoprolol  (Lopressor  100 mg) two hours prior to test. If you take Furosemide/Hydrochlorothiazide/Spironolactone/Chlorthalidone, please HOLD on the morning of the test. Patients who wear a continuous glucose monitor MUST remove the device prior to scanning. FEMALES- please wear underwire-free bra if  available, avoid dresses & tight clothing  After the Test: Drink plenty of water. After receiving IV contrast, you may experience a mild flushed feeling. This is normal. On occasion, you may experience a mild rash up to 24 hours after the test. This is not dangerous. If this occurs, you can take Benadryl 25 mg, Zyrtec, Claritin, or Allegra and increase your fluid intake. (Patients taking Tikosyn should avoid Benadryl, and may take Zyrtec, Claritin, or Allegra) If you experience trouble breathing, this can be serious. If it is severe call 911 IMMEDIATELY. If it is mild, please call our office.  We will call to schedule your test 2-4 weeks out understanding that some insurance companies will need an authorization prior to the service being performed.   For more information and frequently asked questions, please visit our website : [email or web address]  For non-scheduling related questions, please contact the cardiac imaging nurse navigator should you have any questions/concerns: Cardiac Imaging Nurse Navigators Direct Office Dial: [phone]   For scheduling needs, including cancellations and rescheduling, please call [place], [phone].

## 2023-11-29 ENCOUNTER — Telehealth (HOSPITAL_COMMUNITY): Payer: Self-pay | Admitting: *Deleted

## 2023-11-29 ENCOUNTER — Encounter: Payer: Self-pay | Admitting: Medical

## 2023-11-29 ENCOUNTER — Ambulatory Visit: Admitting: Medical

## 2023-11-29 VITALS — BP 122/70 | HR 48 | Ht 65.5 in | Wt 218.0 lb

## 2023-11-29 DIAGNOSIS — H409 Unspecified glaucoma: Secondary | ICD-10-CM

## 2023-11-29 DIAGNOSIS — Z0000 Encounter for general adult medical examination without abnormal findings: Secondary | ICD-10-CM | POA: Diagnosis not present

## 2023-11-29 DIAGNOSIS — Z6834 Body mass index (BMI) 34.0-34.9, adult: Secondary | ICD-10-CM

## 2023-11-29 DIAGNOSIS — I2510 Atherosclerotic heart disease of native coronary artery without angina pectoris: Secondary | ICD-10-CM | POA: Insufficient documentation

## 2023-11-29 DIAGNOSIS — Z1389 Encounter for screening for other disorder: Secondary | ICD-10-CM | POA: Diagnosis not present

## 2023-11-29 DIAGNOSIS — Z1211 Encounter for screening for malignant neoplasm of colon: Secondary | ICD-10-CM

## 2023-11-29 DIAGNOSIS — Z7185 Encounter for immunization safety counseling: Secondary | ICD-10-CM

## 2023-11-29 DIAGNOSIS — I2583 Coronary atherosclerosis due to lipid rich plaque: Secondary | ICD-10-CM

## 2023-11-29 DIAGNOSIS — I10 Essential (primary) hypertension: Secondary | ICD-10-CM

## 2023-11-29 DIAGNOSIS — Z125 Encounter for screening for malignant neoplasm of prostate: Secondary | ICD-10-CM

## 2023-11-29 DIAGNOSIS — E785 Hyperlipidemia, unspecified: Secondary | ICD-10-CM

## 2023-11-29 DIAGNOSIS — I700 Atherosclerosis of aorta: Secondary | ICD-10-CM

## 2023-11-29 DIAGNOSIS — E790 Hyperuricemia without signs of inflammatory arthritis and tophaceous disease: Secondary | ICD-10-CM

## 2023-11-29 DIAGNOSIS — Z86718 Personal history of other venous thrombosis and embolism: Secondary | ICD-10-CM

## 2023-11-29 LAB — POCT URINALYSIS DIP (PROADVANTAGE DEVICE)
Bilirubin, UA: NEGATIVE
Blood, UA: NEGATIVE
Glucose, UA: NEGATIVE mg/dL
Ketones, POC UA: NEGATIVE mg/dL
Leukocytes, UA: NEGATIVE
Nitrite, UA: NEGATIVE
Protein Ur, POC: NEGATIVE mg/dL
Specific Gravity, Urine: 1.005
Urobilinogen, Ur: NEGATIVE
pH, UA: 7.5 (ref 5.0–8.0)

## 2023-11-29 NOTE — Telephone Encounter (Signed)
 Attempted to call patient regarding upcoming cardiac CT appointment. Left message on voicemail with name and callback number Johney Frame RN Navigator Cardiac Imaging [facility] [phone] Office
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
## 2023-11-30 ENCOUNTER — Ambulatory Visit (HOSPITAL_COMMUNITY)
Admission: RE | Admit: 2023-11-30 | Discharge: 2023-11-30 | Disposition: A | Discharge status: Home / Self Care | Source: Ambulatory Visit | Attending: Cardiology | Admitting: Cardiology

## 2023-11-30 DIAGNOSIS — I2510 Atherosclerotic heart disease of native coronary artery without angina pectoris: Secondary | ICD-10-CM | POA: Diagnosis not present

## 2023-11-30 DIAGNOSIS — R079 Chest pain, unspecified: Secondary | ICD-10-CM | POA: Diagnosis present

## 2023-11-30 DIAGNOSIS — R072 Precordial pain: Secondary | ICD-10-CM

## 2023-11-30 DIAGNOSIS — K449 Diaphragmatic hernia without obstruction or gangrene: Secondary | ICD-10-CM | POA: Insufficient documentation

## 2023-11-30 MED ORDER — NITROGLYCERIN 0.4 MG SL SUBL
0.8000 mg | SUBLINGUAL_TABLET | Freq: Once | SUBLINGUAL | Status: AC
  Administered 2023-11-30: 0.8 mg via SUBLINGUAL

## 2023-11-30 MED ORDER — IOHEXOL 350 MG/ML SOLN
95.0000 mL | Freq: Once | INTRAVENOUS | Status: AC | PRN
  Administered 2023-11-30: 95 mL via INTRAVENOUS

## 2023-11-30 NOTE — Progress Notes (Signed)
 Pt presented for a cardiac CT scan.  During the study, the IV in the patient's right arm extravasated. It is estimated to be about 20 ml in the arm.  The patient did feel some warmness so he may have received some IV contrast as well.  Dr. Shlomo was called and she recommends that we put a pressure dressing on the patient and apply an cold pack.  Patient was instructed to seek medical attention if the arm looks worse in addition to written care instructions. Patient verbalized understanding.

## 2023-12-01 ENCOUNTER — Telehealth (HOSPITAL_COMMUNITY): Payer: Self-pay | Admitting: *Deleted

## 2023-12-01 ENCOUNTER — Ambulatory Visit: Payer: Self-pay | Admitting: Cardiology

## 2023-12-01 NOTE — Telephone Encounter (Signed)
 Attempted to call patient to follow up on patient's arm after yesterday's study. Left message with with name and callback number  Chantal Requena RN Navigator Cardiac Imaging [facility] [phone] Office [phone] Cell

## 2023-12-03 ENCOUNTER — Encounter (HOSPITAL_COMMUNITY): Payer: Self-pay

## 2023-12-03 ENCOUNTER — Telehealth: Payer: Self-pay | Admitting: Home Health

## 2023-12-03 NOTE — Telephone Encounter (Signed)
 Noted abnormal CT study from 11/30/23 showed: Suspect severe stenosis in the distal LCx, hemodynamically significant by CT FFR. Severe stenosis ostial D1, small to moderate vessel. Extensive mixed plaque in the proximal to mid LAD. Visually, stenosis appears in the moderate range (50-69%). However, the extent of disease leads to hemodynamic significance (FFR 0.75 mid LAD and 0.68 distal LAD).  Dr Lavona has requested patient to be admitted this week for cath, will need anticoagulation bridge therapy due to DVT. Called bed control, they have requested bed for direct admission, likely Monday or Tuesday, pending bed availability.   I can't get hold of the patient or his emergency contact his wife, called [phone] and [phone].

## 2023-12-04 ENCOUNTER — Telehealth: Payer: Self-pay | Admitting: Cardiology

## 2023-12-04 ENCOUNTER — Observation Stay (HOSPITAL_COMMUNITY)
Admission: AD | Admit: 2023-12-04 | Discharge: 2023-12-05 | Disposition: A | Discharge status: Home / Self Care | Source: Ambulatory Visit | Attending: Internal Medicine | Admitting: Internal Medicine

## 2023-12-04 DIAGNOSIS — I700 Atherosclerosis of aorta: Secondary | ICD-10-CM | POA: Diagnosis present

## 2023-12-04 DIAGNOSIS — Z955 Presence of coronary angioplasty implant and graft: Secondary | ICD-10-CM | POA: Insufficient documentation

## 2023-12-04 DIAGNOSIS — I25119 Atherosclerotic heart disease of native coronary artery with unspecified angina pectoris: Secondary | ICD-10-CM

## 2023-12-04 DIAGNOSIS — K219 Gastro-esophageal reflux disease without esophagitis: Secondary | ICD-10-CM

## 2023-12-04 DIAGNOSIS — Z79899 Other long term (current) drug therapy: Secondary | ICD-10-CM | POA: Insufficient documentation

## 2023-12-04 DIAGNOSIS — Z7902 Long term (current) use of antithrombotics/antiplatelets: Secondary | ICD-10-CM | POA: Diagnosis not present

## 2023-12-04 DIAGNOSIS — Z7982 Long term (current) use of aspirin: Secondary | ICD-10-CM | POA: Diagnosis not present

## 2023-12-04 DIAGNOSIS — I2582 Chronic total occlusion of coronary artery: Secondary | ICD-10-CM | POA: Insufficient documentation

## 2023-12-04 DIAGNOSIS — E785 Hyperlipidemia, unspecified: Secondary | ICD-10-CM | POA: Diagnosis not present

## 2023-12-04 DIAGNOSIS — R0789 Other chest pain: Secondary | ICD-10-CM | POA: Diagnosis not present

## 2023-12-04 DIAGNOSIS — I259 Chronic ischemic heart disease, unspecified: Secondary | ICD-10-CM | POA: Diagnosis not present

## 2023-12-04 DIAGNOSIS — H409 Unspecified glaucoma: Secondary | ICD-10-CM

## 2023-12-04 DIAGNOSIS — F1090 Alcohol use, unspecified, uncomplicated: Secondary | ICD-10-CM | POA: Insufficient documentation

## 2023-12-04 DIAGNOSIS — Z86718 Personal history of other venous thrombosis and embolism: Secondary | ICD-10-CM | POA: Diagnosis not present

## 2023-12-04 DIAGNOSIS — Z0000 Encounter for general adult medical examination without abnormal findings: Secondary | ICD-10-CM

## 2023-12-04 DIAGNOSIS — G4730 Sleep apnea, unspecified: Secondary | ICD-10-CM | POA: Insufficient documentation

## 2023-12-04 DIAGNOSIS — Z7901 Long term (current) use of anticoagulants: Secondary | ICD-10-CM | POA: Diagnosis not present

## 2023-12-04 DIAGNOSIS — I2510 Atherosclerotic heart disease of native coronary artery without angina pectoris: Secondary | ICD-10-CM | POA: Diagnosis present

## 2023-12-04 DIAGNOSIS — I2583 Coronary atherosclerosis due to lipid rich plaque: Secondary | ICD-10-CM | POA: Diagnosis not present

## 2023-12-04 DIAGNOSIS — I10 Essential (primary) hypertension: Secondary | ICD-10-CM | POA: Diagnosis not present

## 2023-12-04 DIAGNOSIS — E790 Hyperuricemia without signs of inflammatory arthritis and tophaceous disease: Secondary | ICD-10-CM

## 2023-12-04 DIAGNOSIS — R079 Chest pain, unspecified: Secondary | ICD-10-CM | POA: Diagnosis present

## 2023-12-04 DIAGNOSIS — I25118 Atherosclerotic heart disease of native coronary artery with other forms of angina pectoris: Secondary | ICD-10-CM | POA: Diagnosis not present

## 2023-12-04 DIAGNOSIS — Z6834 Body mass index (BMI) 34.0-34.9, adult: Secondary | ICD-10-CM

## 2023-12-04 LAB — CBC WITH DIFFERENTIAL/PLATELET
Abs Immature Granulocytes: 0.03 K/uL (ref 0.00–0.07)
Basophils Absolute: 0 K/uL (ref 0.0–0.1)
Basophils Relative: 1 %
Eosinophils Absolute: 0.1 K/uL (ref 0.0–0.5)
Eosinophils Relative: 1 %
HCT: 35.9 % — ABNORMAL LOW (ref 39.0–52.0)
Hemoglobin: 11.7 g/dL — ABNORMAL LOW (ref 13.0–17.0)
Immature Granulocytes: 0 %
Lymphocytes Relative: 27 %
Lymphs Abs: 2.1 K/uL (ref 0.7–4.0)
MCH: 28.1 pg (ref 26.0–34.0)
MCHC: 32.6 g/dL (ref 30.0–36.0)
MCV: 86.1 fL (ref 80.0–100.0)
Monocytes Absolute: 0.6 K/uL (ref 0.1–1.0)
Monocytes Relative: 8 %
Neutro Abs: 5 K/uL (ref 1.7–7.7)
Neutrophils Relative %: 63 %
Platelets: 326 K/uL (ref 150–400)
RBC: 4.17 MIL/uL — ABNORMAL LOW (ref 4.22–5.81)
RDW: 11.8 % (ref 11.5–15.5)
WBC: 7.9 K/uL (ref 4.0–10.5)
nRBC: 0 % (ref 0.0–0.2)

## 2023-12-04 LAB — COMPREHENSIVE METABOLIC PANEL WITH GFR
ALT: 23 U/L (ref 0–44)
AST: 25 U/L (ref 15–41)
Albumin: 4.1 g/dL (ref 3.5–5.0)
Alkaline Phosphatase: 40 U/L (ref 38–126)
Anion gap: 7 (ref 5–15)
BUN: 12 mg/dL (ref 6–20)
CO2: 25 mmol/L (ref 22–32)
Calcium: 9.2 mg/dL (ref 8.9–10.3)
Chloride: 106 mmol/L (ref 98–111)
Creatinine, Ser: 1.05 mg/dL (ref 0.61–1.24)
GFR, Estimated: >60 mL/min (ref >60)
Glucose, Bld: 199 mg/dL — ABNORMAL HIGH (ref 70–99)
Potassium: 4 mmol/L (ref 3.5–5.1)
Sodium: 138 mmol/L (ref 135–145)
Total Bilirubin: 0.8 mg/dL (ref 0.0–1.2)
Total Protein: 7.5 g/dL (ref 6.5–8.1)

## 2023-12-04 LAB — HIV ANTIBODY (ROUTINE TESTING W REFLEX): HIV Screen 4th Generation wRfx: NONREACTIVE

## 2023-12-04 LAB — PROTIME-INR
INR: 1.1 (ref 0.8–1.2)
Prothrombin Time: 15.2 s (ref 11.4–15.2)

## 2023-12-04 MED ORDER — AMLODIPINE BESYLATE 5 MG PO TABS
5.0000 mg | ORAL_TABLET | Freq: Every day | ORAL | Status: DC
Start: 2023-12-04 — End: 2023-12-05
  Administered 2023-12-04 – 2023-12-05 (×2): 5 mg via ORAL
  Filled 2023-12-04 (×2): qty 1

## 2023-12-04 MED ORDER — ROSUVASTATIN CALCIUM 20 MG PO TABS
20.0000 mg | ORAL_TABLET | Freq: Every day | ORAL | Status: DC
  Administered 2023-12-04 – 2023-12-05 (×2): 20 mg via ORAL
  Filled 2023-12-04 (×2): qty 1

## 2023-12-04 MED ORDER — HEPARIN (PORCINE) 25000 UT/250ML-% IV SOLN
1300.0000 [IU]/h | INTRAVENOUS | Status: DC
  Administered 2023-12-04: 1300 [IU]/h via INTRAVENOUS
  Filled 2023-12-04: qty 250

## 2023-12-04 MED ORDER — ALLOPURINOL 100 MG PO TABS
100.0000 mg | ORAL_TABLET | Freq: Every day | ORAL | Status: DC
  Administered 2023-12-05: 100 mg via ORAL
  Filled 2023-12-04: qty 1

## 2023-12-04 MED ORDER — NITROGLYCERIN 0.4 MG SL SUBL: 0.4000 mg | SUBLINGUAL_TABLET | SUBLINGUAL | Status: DC | PRN

## 2023-12-04 MED ORDER — ACETAMINOPHEN 325 MG PO TABS: 650.0000 mg | ORAL_TABLET | ORAL | Status: DC | PRN

## 2023-12-04 MED ORDER — ASPIRIN 81 MG PO CHEW
324.0000 mg | CHEWABLE_TABLET | ORAL | Status: AC
Start: 2023-12-04 — End: 2023-12-04
  Administered 2023-12-04: 324 mg via ORAL
  Filled 2023-12-04: qty 4

## 2023-12-04 MED ORDER — NETARSUDIL DIMESYLATE 0.02 % OP SOLN: 1.0000 [drp] | Freq: Every day | OPHTHALMIC | Status: DC

## 2023-12-04 MED ORDER — BRIMONIDINE TARTRATE 0.15 % OP SOLN
1.0000 [drp] | Freq: Two times a day (BID) | OPHTHALMIC | Status: DC
  Administered 2023-12-04 – 2023-12-05 (×2): 1 [drp] via OPHTHALMIC
  Filled 2023-12-04: qty 5

## 2023-12-04 MED ORDER — ASPIRIN 81 MG PO TBEC: 81.0000 mg | DELAYED_RELEASE_TABLET | Freq: Every day | ORAL | Status: DC

## 2023-12-04 MED ORDER — ASPIRIN 300 MG RE SUPP
300.0000 mg | RECTAL | Status: AC
  Filled 2023-12-04: qty 1

## 2023-12-04 MED ORDER — DORZOLAMIDE HCL-TIMOLOL MAL 2-0.5 % OP SOLN
1.0000 [drp] | Freq: Two times a day (BID) | OPHTHALMIC | Status: DC
  Administered 2023-12-04 – 2023-12-05 (×2): 1 [drp] via OPHTHALMIC
  Filled 2023-12-04: qty 10

## 2023-12-04 MED ORDER — PREDNISOLONE ACETATE 1 % OP SUSP
1.0000 [drp] | Freq: Two times a day (BID) | OPHTHALMIC | Status: DC
  Administered 2023-12-04 – 2023-12-05 (×2): 1 [drp] via OPHTHALMIC
  Filled 2023-12-04: qty 5

## 2023-12-04 NOTE — Telephone Encounter (Signed)
 Spoke with pt regarding a cath. Pt scheduled for cath 7/23. Pt aware that he will receive a call to come to the hospital for a bridge when a bed is ready today 7/21. Carley Bonds aware. Pt verbalized understanding. All questions if any were answered.

## 2023-12-04 NOTE — Progress Notes (Addendum)
 PHARMACY - ANTICOAGULATION CONSULT NOTE  Pharmacy Consult for heparin  Indication: history DVT  No Known Allergies  Patient Measurements: Height: 5' 5 (165.1 cm) Weight: 98.4 kg (217 lb) IBW/kg (Calculated) : 61.5 HEPARIN  DW (KG): 83.3  Vital Signs: Temp: 98.3 F (36.8 C) (07/21 1700) Temp Source: Oral (07/21 1700) BP: 132/83 (07/21 1700) Pulse Rate: 72 (07/21 1700)  Labs: No results for input(s): HGB, HCT, PLT, APTT, LABPROT, INR, HEPARINUNFRC, HEPRLOWMOCWT, CREATININE, CKTOTAL, CKMB, TROPONINIHS in the last 72 hours.  Estimated Creatinine Clearance: 92.7 mL/min (by C-G formula based on SCr of 0.96 mg/dL).   Medical History: Past Medical History:  Diagnosis Date   Allergy    DVT (deep venous thrombosis) (HCC)    Elevated uric acid in blood    Erectile dysfunction    GERD (gastroesophageal reflux disease)    Glaucoma    Hernia, abdominal    High cholesterol    Hypertension    Prediabetes 2025   Retinal detachment     Medications:  Medications Prior to Admission  Medication Sig Dispense Refill Last Dose/Taking   allopurinol  (ZYLOPRIM ) 100 MG tablet Take 1 tablet (100 mg total) by mouth daily. 90 tablet 1    amLODipine  (NORVASC ) 5 MG tablet Take 1 tablet (5 mg total) by mouth daily. 90 tablet 2    apixaban  (ELIQUIS ) 5 MG TABS tablet Take 1 tablet (5 mg total) by mouth 2 (two) times daily. 180 tablet 1    brimonidine  (ALPHAGAN ) 0.15 % ophthalmic solution Place 1 drop into both eyes 2 (two) times daily.      dorzolamide -timolol  (COSOPT ) 22.3-6.8 MG/ML ophthalmic solution Place 1 drop into both eyes 2 (two) times daily.      metoprolol  tartrate (LOPRESSOR ) 100 MG tablet Take 1 tablet (100 mg total) by mouth once for 1 dose. Take 90-120 minutes prior to scan. Hold for SBP less than 110. 1 tablet 0    Netarsudil  Dimesylate 0.02 % SOLN Apply 1 drop to eye daily.      prednisoLONE  acetate (PRED FORTE ) 1 % ophthalmic suspension Place 1 drop into the  right eye 2 (two) times daily.      rosuvastatin  (CRESTOR ) 20 MG tablet Take 1 tablet (20 mg total) by mouth daily. 90 tablet 3    sildenafil  (VIAGRA ) 100 MG tablet Take 0.5-1 tablets (50-100 mg total) by mouth daily as needed for erectile dysfunction. 10 tablet 1     Assessment: 58 yo male here for cardiac cath. He is on apixban PTA for a recent DVT (07/2023). Pharmacy consulted to dose heparin    -per family, last dose of apixaban  was 12/03/23 in the evning  Goal of Therapy:  Heparin  level 0.3-0.7 units/ml aPTT 66-102 seconds Monitor platelets by anticoagulation protocol: Yes   Plan:  -No heparin  bolus as he is on apixaban  at home -Start heparin  1300 units/hr -heparin  level and aPTT in 6 hours  Prentice Poisson, PharmD Clinical Pharmacist **Pharmacist phone directory can now be found on [email or web address] ([email or web address]).  Listed under [facility].

## 2023-12-04 NOTE — Telephone Encounter (Signed)
-----   Message from Lynwood Schilling sent at 11/05/2023  8:21 PM EDT ----- He does have increased calcium .  I would like to get a POET (Plain Old Exercise Treadmill) before I see him in July.  Call with the patients and send results to Tysinger, Alm RAMAN, PA-C                      ----- Message ----- From: Interface, Rad Results In Sent: 11/03/2023   8:07 PM EDT To: Lynwood Schilling, MD

## 2023-12-04 NOTE — Telephone Encounter (Addendum)
 I called bed control and they have the pt on their priority list to get in hoping prior to 4 pm today... they had some issues with some rooms [address] so there was a delay but they have discharges and they will be able to get the pt admitted tonight.   I called the pt and explained the plan and he agreed and verbalized understanding.   Card master/ Trish advised.

## 2023-12-04 NOTE — Telephone Encounter (Signed)
-----   Message from Lynwood Schilling sent at 12/01/2023  1:38 PM EDT ----- High risk coronary CT.  Needs to be admitted for cardiac cath.  I would like for him to be admitted for bridging of his Eliquis .  He could skip the morning dose and be admitted in the afternoon and  have pharmacy bridge.  I don't think he would be able to do Lovenox and he had a recent DVT.  He has had increased DOE.  Should be cathed next week.  I tried to call both of his numbers and his wife  and I could not get through.  Please call to arranged.  I did talk to Carley Bonds who is aware that we will be admitting next week.  Please let her know when this is.  We need to call bed control for  the bed.   Again,  I was not able to get in contact with him.  ----- Message ----- From: Interface, Rad Results In Sent: 11/30/2023   3:59 PM EDT To: Lynwood Schilling, MD

## 2023-12-04 NOTE — Telephone Encounter (Signed)
 Patient stated he is following-up on RN's call regarding surgery.
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
## 2023-12-04 NOTE — Telephone Encounter (Signed)
 Spoke with pt regarding results. Pt aware of POET. Order placed. Attestation pended. Pt instructions sent via MyChart per pt request. Pt verbalized understanding. All questions if any were answered.

## 2023-12-05 ENCOUNTER — Encounter (HOSPITAL_COMMUNITY): Payer: Self-pay | Admitting: Cardiology

## 2023-12-05 ENCOUNTER — Encounter (HOSPITAL_COMMUNITY)
Admission: AD | Disposition: A | Discharge status: Home / Self Care | Payer: Self-pay | Source: Ambulatory Visit | Attending: Internal Medicine

## 2023-12-05 ENCOUNTER — Ambulatory Visit (HOSPITAL_COMMUNITY): Admission: RE | Admit: 2023-12-05 | Source: Home / Self Care | Admitting: Cardiology

## 2023-12-05 ENCOUNTER — Other Ambulatory Visit (HOSPITAL_COMMUNITY): Payer: Self-pay

## 2023-12-05 ENCOUNTER — Other Ambulatory Visit: Payer: Self-pay | Admitting: Cardiology

## 2023-12-05 DIAGNOSIS — Z86718 Personal history of other venous thrombosis and embolism: Secondary | ICD-10-CM | POA: Diagnosis not present

## 2023-12-05 DIAGNOSIS — Z955 Presence of coronary angioplasty implant and graft: Secondary | ICD-10-CM

## 2023-12-05 DIAGNOSIS — I10 Essential (primary) hypertension: Secondary | ICD-10-CM

## 2023-12-05 DIAGNOSIS — I2510 Atherosclerotic heart disease of native coronary artery without angina pectoris: Secondary | ICD-10-CM

## 2023-12-05 DIAGNOSIS — I25118 Atherosclerotic heart disease of native coronary artery with other forms of angina pectoris: Secondary | ICD-10-CM | POA: Diagnosis not present

## 2023-12-05 DIAGNOSIS — I700 Atherosclerosis of aorta: Secondary | ICD-10-CM | POA: Diagnosis not present

## 2023-12-05 DIAGNOSIS — R0789 Other chest pain: Secondary | ICD-10-CM | POA: Diagnosis not present

## 2023-12-05 DIAGNOSIS — I259 Chronic ischemic heart disease, unspecified: Secondary | ICD-10-CM | POA: Diagnosis not present

## 2023-12-05 HISTORY — PX: CORONARY STENT INTERVENTION: CATH118234

## 2023-12-05 HISTORY — PX: CORONARY BALLOON ANGIOPLASTY: CATH118233

## 2023-12-05 HISTORY — PX: LEFT HEART CATH AND CORONARY ANGIOGRAPHY: CATH118249

## 2023-12-05 LAB — BASIC METABOLIC PANEL WITH GFR
Anion gap: 8 (ref 5–15)
BUN: 15 mg/dL (ref 6–20)
CO2: 24 mmol/L (ref 22–32)
Calcium: 8.7 mg/dL — ABNORMAL LOW (ref 8.9–10.3)
Chloride: 103 mmol/L (ref 98–111)
Creatinine, Ser: 1.02 mg/dL (ref 0.61–1.24)
GFR, Estimated: >60 mL/min (ref >60)
Glucose, Bld: 129 mg/dL — ABNORMAL HIGH (ref 70–99)
Potassium: 3.5 mmol/L (ref 3.5–5.1)
Sodium: 135 mmol/L (ref 135–145)

## 2023-12-05 LAB — APTT: aPTT: 68 s — ABNORMAL HIGH (ref 24–36)

## 2023-12-05 LAB — CBC
HCT: 33.3 % — ABNORMAL LOW (ref 39.0–52.0)
Hemoglobin: 11.1 g/dL — ABNORMAL LOW (ref 13.0–17.0)
MCH: 28.7 pg (ref 26.0–34.0)
MCHC: 33.3 g/dL (ref 30.0–36.0)
MCV: 86 fL (ref 80.0–100.0)
Platelets: 280 K/uL (ref 150–400)
RBC: 3.87 MIL/uL — ABNORMAL LOW (ref 4.22–5.81)
RDW: 11.9 % (ref 11.5–15.5)
WBC: 6.5 K/uL (ref 4.0–10.5)
nRBC: 0 % (ref 0.0–0.2)

## 2023-12-05 LAB — POCT ACTIVATED CLOTTING TIME
Activated Clotting Time: 239 s
Activated Clotting Time: 256 s
Activated Clotting Time: 273 s

## 2023-12-05 LAB — HEPARIN LEVEL (UNFRACTIONATED): Heparin Unfractionated: 0.29 [IU]/mL — ABNORMAL LOW (ref 0.30–0.70)

## 2023-12-05 LAB — MRSA NEXT GEN BY PCR, NASAL: MRSA by PCR Next Gen: NOT DETECTED

## 2023-12-05 SURGERY — LEFT HEART CATH AND CORONARY ANGIOGRAPHY
Anesthesia: LOCAL

## 2023-12-05 MED ORDER — SODIUM CHLORIDE 0.9 % WEIGHT BASED INFUSION
1.0000 mL/kg/h | INTRAVENOUS | Status: DC
  Administered 2023-12-05: 1 mL/kg/h via INTRAVENOUS

## 2023-12-05 MED ORDER — SODIUM CHLORIDE 0.9 % IV SOLN: 250.0000 mL | INTRAVENOUS | Status: DC | PRN

## 2023-12-05 MED ORDER — CLOPIDOGREL BISULFATE 300 MG PO TABS
ORAL_TABLET | ORAL | Status: AC
  Filled 2023-12-05: qty 2

## 2023-12-05 MED ORDER — NITROGLYCERIN 0.4 MG SL SUBL
0.4000 mg | SUBLINGUAL_TABLET | SUBLINGUAL | 3 refills | Status: AC | PRN
  Filled 2023-12-05: qty 25, 8d supply, fill #0

## 2023-12-05 MED ORDER — ASPIRIN 81 MG PO TBEC
81.0000 mg | DELAYED_RELEASE_TABLET | Freq: Every day | ORAL | 0 refills | Status: AC
  Filled 2023-12-05: qty 30, 30d supply, fill #0

## 2023-12-05 MED ORDER — HYDRALAZINE HCL 20 MG/ML IJ SOLN: 10.0000 mg | INTRAMUSCULAR | Status: AC | PRN

## 2023-12-05 MED ORDER — VERAPAMIL HCL 2.5 MG/ML IV SOLN
INTRAVENOUS | Status: DC | PRN
  Administered 2023-12-05: 10 mL via INTRA_ARTERIAL

## 2023-12-05 MED ORDER — MIDAZOLAM HCL 2 MG/2ML IJ SOLN
INTRAMUSCULAR | Status: DC | PRN
  Administered 2023-12-05: 2 mg via INTRAVENOUS

## 2023-12-05 MED ORDER — HEPARIN SODIUM (PORCINE) 1000 UNIT/ML IJ SOLN
INTRAMUSCULAR | Status: DC | PRN
  Administered 2023-12-05 (×2): 5000 [IU] via INTRAVENOUS
  Administered 2023-12-05: 3000 [IU] via INTRAVENOUS
  Administered 2023-12-05: 2000 [IU] via INTRAVENOUS

## 2023-12-05 MED ORDER — IOHEXOL 350 MG/ML SOLN
INTRAVENOUS | Status: DC | PRN
  Administered 2023-12-05: 175 mL

## 2023-12-05 MED ORDER — SODIUM CHLORIDE 0.9 % WEIGHT BASED INFUSION
3.0000 mL/kg/h | INTRAVENOUS | Status: DC
  Administered 2023-12-05: 3 mL/kg/h via INTRAVENOUS

## 2023-12-05 MED ORDER — CLOPIDOGREL BISULFATE 300 MG PO TABS
ORAL_TABLET | ORAL | Status: DC | PRN
  Administered 2023-12-05: 600 mg via ORAL

## 2023-12-05 MED ORDER — SODIUM CHLORIDE 0.9% FLUSH: 3.0000 mL | Freq: Two times a day (BID) | INTRAVENOUS | Status: DC

## 2023-12-05 MED ORDER — VALSARTAN 80 MG PO TABS
80.0000 mg | ORAL_TABLET | Freq: Every day | ORAL | 3 refills | Status: DC
  Filled 2023-12-05: qty 30, 30d supply, fill #0

## 2023-12-05 MED ORDER — ASPIRIN 81 MG PO TBEC
81.0000 mg | DELAYED_RELEASE_TABLET | Freq: Every day | ORAL | 3 refills | Status: DC
  Filled 2023-12-05: qty 90, 90d supply, fill #0

## 2023-12-05 MED ORDER — FENTANYL CITRATE (PF) 100 MCG/2ML IJ SOLN
INTRAMUSCULAR | Status: DC | PRN
  Administered 2023-12-05: 25 ug via INTRAVENOUS

## 2023-12-05 MED ORDER — APIXABAN 5 MG PO TABS: 5.0000 mg | ORAL_TABLET | Freq: Two times a day (BID) | ORAL | Status: DC

## 2023-12-05 MED ORDER — LIDOCAINE HCL (PF) 1 % IJ SOLN
INTRAMUSCULAR | Status: DC | PRN
  Administered 2023-12-05: 2 mL via INTRADERMAL

## 2023-12-05 MED ORDER — SODIUM CHLORIDE 0.9% FLUSH: 3.0000 mL | INTRAVENOUS | Status: DC | PRN

## 2023-12-05 MED ORDER — HEPARIN SODIUM (PORCINE) 1000 UNIT/ML IJ SOLN
INTRAMUSCULAR | Status: AC
  Filled 2023-12-05: qty 10

## 2023-12-05 MED ORDER — HEPARIN (PORCINE) IN NACL 1000-0.9 UT/500ML-% IV SOLN
INTRAVENOUS | Status: DC | PRN
  Administered 2023-12-05 (×2): 500 mL

## 2023-12-05 MED ORDER — NITROGLYCERIN 1 MG/10 ML FOR IR/CATH LAB
INTRA_ARTERIAL | Status: DC | PRN
  Administered 2023-12-05 (×3): 200 ug via INTRACORONARY

## 2023-12-05 MED ORDER — ASPIRIN 81 MG PO TBEC: 81.0000 mg | DELAYED_RELEASE_TABLET | Freq: Every day | ORAL | Status: DC

## 2023-12-05 MED ORDER — FENTANYL CITRATE (PF) 100 MCG/2ML IJ SOLN
INTRAMUSCULAR | Status: AC
  Filled 2023-12-05: qty 2

## 2023-12-05 MED ORDER — CLOPIDOGREL BISULFATE 75 MG PO TABS: 75.0000 mg | ORAL_TABLET | Freq: Every day | ORAL | Status: DC

## 2023-12-05 MED ORDER — LIDOCAINE HCL (PF) 1 % IJ SOLN
INTRAMUSCULAR | Status: AC
  Filled 2023-12-05: qty 30

## 2023-12-05 MED ORDER — LABETALOL HCL 5 MG/ML IV SOLN: 10.0000 mg | INTRAVENOUS | Status: AC | PRN

## 2023-12-05 MED ORDER — NITROGLYCERIN 1 MG/10 ML FOR IR/CATH LAB
INTRA_ARTERIAL | Status: AC
  Filled 2023-12-05: qty 10

## 2023-12-05 MED ORDER — VERAPAMIL HCL 2.5 MG/ML IV SOLN
INTRAVENOUS | Status: AC
  Filled 2023-12-05: qty 2

## 2023-12-05 MED ORDER — ASPIRIN 81 MG PO CHEW
81.0000 mg | CHEWABLE_TABLET | ORAL | Status: AC
  Administered 2023-12-05: 81 mg via ORAL
  Filled 2023-12-05: qty 1

## 2023-12-05 MED ORDER — MIDAZOLAM HCL 2 MG/2ML IJ SOLN
INTRAMUSCULAR | Status: AC
  Filled 2023-12-05: qty 2

## 2023-12-05 MED ORDER — CLOPIDOGREL BISULFATE 75 MG PO TABS
75.0000 mg | ORAL_TABLET | Freq: Every day | ORAL | 3 refills | Status: DC
  Filled 2023-12-05: qty 30, 30d supply, fill #0

## 2023-12-05 MED ORDER — ONDANSETRON HCL 4 MG/2ML IJ SOLN: 4.0000 mg | Freq: Four times a day (QID) | INTRAMUSCULAR | Status: DC | PRN

## 2023-12-05 MED ORDER — SODIUM CHLORIDE 0.9 % IV SOLN: INTRAVENOUS | Status: AC

## 2023-12-05 SURGICAL SUPPLY — 18 items
BALLOON EMERGE MR 2.0X12 (BALLOONS) IMPLANT
BALLOON EMERGE MR 2.5X12 (BALLOONS) IMPLANT
BALLOON SAPPHIRE NC24 3.0X15 (BALLOONS) IMPLANT
BALLOON TAKERU 1.5X12 (BALLOONS) IMPLANT
CATH 5FR JL3.5 JR4 ANG PIG MP (CATHETERS) IMPLANT
CATH VISTA GUIDE 6FR XBLD 3.5 (CATHETERS) IMPLANT
DEVICE RAD COMP TR BAND LRG (VASCULAR PRODUCTS) IMPLANT
GLIDESHEATH SLEND SS 6F .021 (SHEATH) IMPLANT
GUIDEWIRE INQWIRE 1.5J.035X260 (WIRE) IMPLANT
KIT ENCORE 26 ADVANTAGE (KITS) IMPLANT
KIT HEMO VALVE WATCHDOG (MISCELLANEOUS) IMPLANT
KIT SYRINGE INJ CVI SPIKEX1 (MISCELLANEOUS) IMPLANT
PACK CARDIAC CATHETERIZATION (CUSTOM PROCEDURE TRAY) ×1 IMPLANT
SET ATX-X65L (MISCELLANEOUS) IMPLANT
SHEATH PROBE COVER 6X72 (BAG) IMPLANT
STENT ONYX FRONTIER 2.75X18 (Permanent Stent) IMPLANT
WIRE ASAHI PROWATER 180CM (WIRE) IMPLANT
WIRE RUNTHROUGH IZANAI 014 180 (WIRE) IMPLANT

## 2023-12-05 NOTE — Discharge Instructions (Addendum)

## 2023-12-05 NOTE — Plan of Care (Signed)
   Problem: Education: Goal: Knowledge of General Education information will improve Description: Including pain rating scale, medication(s)/side effects and non-pharmacologic comfort measures Outcome: Progressing   Problem: Health Behavior/Discharge Planning: Goal: Ability to manage health-related needs will improve Outcome: Progressing   Problem: Clinical Measurements: Goal: Cardiovascular complication will be avoided Outcome: Progressing

## 2023-12-05 NOTE — Progress Notes (Signed)
 PHARMACY - ANTICOAGULATION CONSULT NOTE  Pharmacy Consult for heparin  Indication: CP/ACS and recent h/o VTE  Labs: Recent Labs    12/04/23 1840 12/04/23 1906 12/05/23 0214  HGB 11.7*  --   --   HCT 35.9*  --   --   PLT 326  --   --   APTT  --   --  68*  LABPROT  --  15.2  --   INR  --  1.1  --   HEPARINUNFRC  --   --  0.29*  CREATININE 1.05  --   --    Assessment/Plan:  58yo male therapeutic on heparin  with initial dosing while DOAC on hold. Will continue infusion at current rate of 1300 units/hr and confirm stable with additional PTT.  Marvetta Dauphin, PharmD, BCPS 12/05/2023 3:02 AM

## 2023-12-05 NOTE — Discharge Summary (Addendum)
 Discharge Summary   Patient ID: [name] [name] MRN: [id number]; DOB: [birth year]  Admit date: 12/04/2023 Discharge date: 12/05/2023  PCP:  Bulah Alm RAMAN, PA-C   [facility] Providers Cardiologist:  Lynwood Schilling, MD     Discharge Diagnoses  Principal Problem:   Chest pain Active Problems:   Essential hypertension, benign   Aortic atherosclerosis (HCC)   History of DVT (deep vein thrombosis)   Hyperlipidemia   Coronary artery disease due to lipid rich plaque   S/P drug eluting coronary stent placement  Diagnostic Studies/Procedures   Cardiac catheterization, 12/05/2023   CULPRIT LESION: Prox LAD lesion is 70% stenosed.  At 1st Diag-1 lesion is 99% stenosed.   A drug-eluting stent was successfully placed using a STENT ONYX FRONTIER 2.75X18 in the main branch and PTCA of side branch.   Balloon angioplasty was performed in the side branch, using a BALLOON EMERGE MR 2.0X12.   Post intervention, there is a 0% residual stenosis.  Post intervention, there is a 90% residual stenosis in the ostial sidebranch.  With TIMI-3 flow maintained in both main and sidebranch.   --- 1st Diag-2 lesion is 70% stenosed in the proxima segment beyond the ostium   Balloon angioplasty was performed using a BALLOON EMERGE MR 2.0X12.  Post intervention, there is a 20% residual stenosis.   Prox RCA to Dist RCA lesion is 100% stenosed with 100% stenosed side branch in RPAV. RPDA lesion is 100% stenosed.  Minimal left-to-right collaterals to the PDA   ---   LCx is several small caliber OM branches with no significant disease until distal vessels.   ---   LV end diastolic pressure is normal.  There is no aortic valve stenosis.  RECOMMENDATIONS   In the absence of any other complications or medical issues, we expect the patient to be ready for discharge from an interventional cardiology perspective on 12/05/2023.   Continue to titrate GDMT Plan will be to continue to treat the diagonal lesion  medically.  Vessel is too small for safe stenting.   Recommend to resume Apixaban , at currently prescribed dose and frequency on 12/05/2023.   Recommend concurrent antiplatelet therapy of Aspirin  81 mg for 1 month and Clopidogrel  75mg  daily for 6 months . _____________   History of Present Illness   [name] [name] is a 58 y.o. male with hypertension, sleep apnea, elevated coronary calcium  score, dyslipidemia, prior history of DVT on Eliquis  who presented with abnormal coronary CTA and chest pain, admitted to undergo a cardiac catheterization.   Hospital Course   Patient presented to [facility] on 12/04/2023 for admission to undergo cardiac cath after having an elevated coronary calcium  score and chest pain, then sent for coronary CTA which showed poor quality but was considered high risk due to FFR positive mid LAD lesion and distal LCX.  He underwent cardiac catheterization on 12/05/2023 performed by Dr. Anner. Her cath showed: LAD stent for 70% ulcerated lesion at D1. There was an attempt for balloon angioplasty at D1, there was significant spasms afterwards so there is ~90% stenosis with TIMI-3 flow. Unable to rewire. It was determined the best plan was to treat medically. Full results as above.   Recommendations post-cath include: titrate GDMT to treat diagonal lesion medically, resume Eliquis  on 7/22, DAPT with ASA 81 mg + Plavix  75 mg for at least 6 months.   Treatment plan is as below:  Coronary artery disease s/p DES to LAD and balloon angioplasty  Hypertension Continue DAPT with ASA 91  mg + Plavix  75 mg x at least 6 months Continue amlodipine  5 mg daily  Add SL nitroglycerin  PRN for chest pain -- discussed importance of not using within 24 hours of Viagra   Start Valsartan  80 mg daily -- check renal function in 2 weeks   Hyperlipidemia  Continue Crestor  20 mg daily   DVT Continue Eliquis  5 mg BID starting tonight      Did the patient have an acute coronary syndrome  (MI, NSTEMI, STEMI, etc) this admission?:  No                               Did the patient have a percutaneous coronary intervention (stent / angioplasty)?:  Yes.    Cath/PCI Registry Performance & Quality Measures: Aspirin  prescribed? - Yes ADP Receptor Inhibitor (Plavix /Clopidogrel , Brilinta/Ticagrelor or Effient/Prasugrel) prescribed (includes medically managed patients)? - Yes High Intensity Statin (Lipitor 40-80mg  or Crestor  20-40mg ) prescribed? - Yes For EF <40%, was ACEI/ARB prescribed? - Yes For EF <40%, Aldosterone Antagonist (Spironolactone or Eplerenone) prescribed? - Not Applicable (EF >/= 40%) Cardiac Rehab Phase II ordered? - Yes    _____________  Discharge Vitals Blood pressure (!) 153/101, pulse (!) 56, temperature 98.2 F (36.8 C), temperature source Oral, resp. rate 19, height 5' 5 (1.651 m), weight 97.7 kg, SpO2 98%.  Filed Weights   12/04/23 1700 12/05/23 0355  Weight: 98.4 kg 97.7 kg   Physical Exam  General:  Well nourished, well developed, in no acute distress HEENT: nystagmus noted Neck: no JVD Vascular: No carotid bruits; Distal pulses 2+ bilaterally   Cardiac:  normal S1, S2; RRR; no murmur, R radial access site healing well   Lungs:  clear to auscultation bilaterally, no wheezing, rhonchi or rales  Abd: soft, nontender, no hepatomegaly  Ext: no edema Musculoskeletal:  No deformities Skin: warm and dry  Neuro:  no focal abnormalities noted Psych:  Normal affect    Labs & Radiologic Studies  CBC Recent Labs    12/04/23 1840 12/05/23 0516  WBC 7.9 6.5  NEUTROABS 5.0  --   HGB 11.7* 11.1*  HCT 35.9* 33.3*  MCV 86.1 86.0  PLT 326 280   Basic Metabolic Panel Recent Labs    92/78/74 1840 12/05/23 0516  NA 138 135  K 4.0 3.5  CL 106 103  CO2 25 24  GLUCOSE 199* 129*  BUN 12 15  CREATININE 1.05 1.02  CALCIUM  9.2 8.7*   Liver Function Tests Recent Labs    12/04/23 1840  AST 25  ALT 23  ALKPHOS 40  BILITOT 0.8  PROT 7.5  ALBUMIN  4.1   No results for input(s): LIPASE, AMYLASE in the last 72 hours. High Sensitivity Troponin:   No results for input(s): TROPONINIHS in the last 720 hours.  No results for input(s): TRNPT in the last 720 hours.  BNP Invalid input(s): POCBNP No results for input(s): PROBNP in the last 72 hours.  No results for input(s): BNP in the last 72 hours.  D-Dimer No results for input(s): DDIMER in the last 72 hours. Hemoglobin A1C No results for input(s): HGBA1C in the last 72 hours. Fasting Lipid Panel No results for input(s): CHOL, HDL, LDLCALC, TRIG, CHOLHDL, LDLDIRECT in the last 72 hours. No results found for: LIPOA  Thyroid Function Tests No results for input(s): TSH, T4TOTAL, T3FREE, THYROIDAB in the last 72 hours.  Invalid input(s): FREET3 _____________  CARDIAC CATHETERIZATION Result Date: 12/05/2023 Images from the  original result were not included.   CULPRIT LESION: Prox LAD lesion is 70% stenosed.  At 1st Diag-1 lesion is 99% stenosed.   A drug-eluting stent was successfully placed using a STENT ONYX FRONTIER 2.75X18 in the main branch and PTCA of side branch.   Balloon angioplasty was performed in the side branch, using a BALLOON EMERGE MR 2.0X12.   Post intervention, there is a 0% residual stenosis.  Post intervention, there is a 90% residual stenosis in the ostial sidebranch.  With TIMI-3 flow maintained in both main and sidebranch.   --- 1st Diag-2 lesion is 70% stenosed in the proxima segment beyond the ostium   Balloon angioplasty was performed using a BALLOON EMERGE MR 2.0X12.  Post intervention, there is a 20% residual stenosis.   Prox RCA to Dist RCA lesion is 100% stenosed with 100% stenosed side branch in RPAV. RPDA lesion is 100% stenosed.  Minimal left-to-right collaterals to the PDA   ---   LCx is several small caliber OM branches with no significant disease until distal vessels.   ---   LV end diastolic pressure is normal.  There is  no aortic valve stenosis. Diagnostic:  Dominance: Right       Intervention  RECOMMENDATIONS   In the absence of any other complications or medical issues, we expect the patient to be ready for discharge from an interventional cardiology perspective on 12/05/2023.   Continue to titrate GDMT Plan will be to continue to treat the diagonal lesion medically.  Vessel is too small for safe stenting.   Recommend to resume Apixaban , at currently prescribed dose and frequency on 12/05/2023.   Recommend concurrent antiplatelet therapy of Aspirin  81 mg for 1 month and Clopidogrel  75mg  daily for 6 months . [name], MD  CT CORONARY [facility] W/CTA COR W/SCORE DEL W/CM &/OR WO/CM Addendum Date: 12/05/2023 ADDENDUM REPORT: 12/01/2023 08:43 CLINICAL DATA:  Chest pain EXAM: Cardiac CTA MEDICATIONS: Sub lingual nitro. 4mg  x 2 TECHNIQUE: A non-contrast, gated CT scan was obtained with axial slices of 2.5 mm through the heart for calcium  scoring. Calcium  scoring was performed using the Agatston method. A 120 kV prospective, gated, contrast cardiac CT scan was obtained. Gantry rotation speed was 230 msec and collimation was 0.63 mm. Two sublingual nitroglycerin  tablets (0.8 mg) were given. The 3D data set was reconstructed with motion correction for the best systolic or diastolic phase. Images were analyzed on a dedicated workstation using MPR, MIP, and VRT modes. The patient received 95 cc contrast. FINDINGS: Non-cardiac: See separate report from [facility]. Normal caliber aortic root and ascending aorta. Pulmonary veins drain normally to the left atrium. No LA appendage thrombus. Calcium  Score: 1075 Agatston units. Coronary Arteries: Right dominant with no anomalies LM: Mixed plaque, mild (1-24%) stenosis. LAD system: Small to moderate D1 with mixed plaque and severe (70-99%) stenosis proximally, CT FFR 0.69. The LAD itself has extensive mixed plaque in the proximal and mid vessel. Visually, there is moderate (50-69%)  stenosis. However, there is a long area of disease and CT FFR suggests hemodynamic significance by the mid LAD (FFR 0.75). There is no significant disease in the distal LAD, but FFR is 0.68 in the distal LAD showing that the extensive proximal to mid LAD disease is indeed hemodynamically significant. Circumflex system: Moderate OM1, minimal disease. Calcified plaque mid LCx, mild (25-49%) stenosis. Images of the distal LCx were poor, but there is calcified plaque present with concern for severe (70-99%) stenosis. Distal LCx disease appears hemodynamically significant  by CT FFR (0.74). RCA system: Images of the RCA were poor. There is extensive mixed plaque in the proximal RCA, visually appears to be moderate (50-69%) stenosis but not hemodynamically significant by CT FFR. There is a moderate acute marginal with noncalcified plaque proximally and visually moderate (50-69%) stenosis, not hemodynamically significant by CT FFR (0.81). IMPRESSION: 1. Coronary artery calcium  score 1075 Agatston units. This places the patient in the 99th percentile for age and gender, suggesting high risk for future cardiac events. 2.  Coronary images were poor on this study. 3. Visually moderate disease in the proxmal RCA and proximal acute marginal, not hemodynamically significant by CT FFR. 4. Suspect severe stenosis in the distal LCx, hemodynamically significant by CT FFR. 5.  Severe stenosis ostial D1, small to moderate vessel. 6. Extensive mixed plaque in the proximal to mid LAD. Visually, stenosis appears in the moderate range (50-69%). However, the extent of disease leads to hemodynamic significance (FFR 0.75 mid LAD and 0.68 distal LAD). Dalton Mclean Electronically Signed   By: Ezra Shuck [name]   On: 12/01/2023 08:43   Addendum Date: 12/01/2023 ADDENDUM REPORT: 12/01/2023 08:43 CLINICAL DATA:  Chest pain EXAM: Cardiac CTA MEDICATIONS: Sub lingual nitro. 4mg  x 2 TECHNIQUE: A non-contrast, gated CT scan was obtained with axial  slices of 2.5 mm through the heart for calcium  scoring. Calcium  scoring was performed using the Agatston method. A 120 kV prospective, gated, contrast cardiac CT scan was obtained. Gantry rotation speed was 230 msec and collimation was 0.63 mm. Two sublingual nitroglycerin  tablets (0.8 mg) were given. The 3D data set was reconstructed with motion correction for the best systolic or diastolic phase. Images were analyzed on a dedicated workstation using MPR, MIP, and VRT modes. The patient received 95 cc contrast. FINDINGS: Non-cardiac: See separate report from [facility]. Normal caliber aortic root and ascending aorta. Pulmonary veins drain normally to the left atrium. No LA appendage thrombus. Calcium  Score: 1075 Agatston units. Coronary Arteries: Right dominant with no anomalies LM: Mixed plaque, mild (1-24%) stenosis. LAD system: Small to moderate D1 with mixed plaque and severe (70-99%) stenosis proximally, CT FFR 0.69. The LAD itself has extensive mixed plaque in the proximal and mid vessel. Visually, there is moderate (50-69%) stenosis. However, there is a long area of disease and CT FFR suggests hemodynamic significance by the mid LAD (FFR 0.75). There is no significant disease in the distal LAD, but FFR is 0.68 in the distal LAD showing that the extensive proximal to mid LAD disease is indeed hemodynamically significant. Circumflex system: Moderate OM1, minimal disease. Calcified plaque mid LCx, mild (25-49%) stenosis. Images of the distal LCx were poor, but there is calcified plaque present with concern for severe (70-99%) stenosis. Distal LCx disease appears hemodynamically significant by CT FFR (0.74). RCA system: Images of the RCA were poor. There is extensive mixed plaque in the proximal RCA, visually appears to be moderate (50-69%) stenosis but not hemodynamically significant by CT FFR. There is a moderate acute marginal with noncalcified plaque proximally and visually moderate (50-69%)  stenosis, not hemodynamically significant by CT FFR (0.81). IMPRESSION: 1. Coronary artery calcium  score 1075 Agatston units. This places the patient in the 99th percentile for age and gender, suggesting high risk for future cardiac events. 2.  Coronary images were poor on this study. 3. Visually moderate disease in the proxmal RCA and proximal acute marginal, not hemodynamically significant by CT FFR. 4. Suspect severe stenosis in the distal LCx, hemodynamically significant by CT FFR. 5.  Severe stenosis ostial D1, small to moderate vessel. 6. Extensive mixed plaque in the proximal to mid LAD. Visually, stenosis appears in the moderate range (50-69%). However, the extent of disease leads to hemodynamic significance (FFR 0.75 mid LAD and 0.68 distal LAD). Dalton Mclean Electronically Signed   By: Ezra Shuck [name]   On: 12/01/2023 08:43   Result Date: 12/01/2023 EXAM: OVER-READ INTERPRETATION  CT CHEST The following report is a limited chest CT over-read performed by radiologist Dr. Lynwood Landy Raddle of [facility] on 11/30/2023. This over-read does not include interpretation of cardiac or coronary anatomy or pathology. The coronary CTA interpretation by the cardiologist is attached. COMPARISON:  Sep 14, 2023. FINDINGS: Vascular: There is no definite abnormality seen involving the visualized portions of the extracardiac vascular structures of the chest. Mediastinum/Nodes: Moderate sized sliding-type hiatal hernia. Lungs/Pleura: Mild right middle lobe subsegmental atelectasis is noted. Upper Abdomen: No definite abnormality seen involving visualized portion of upper abdomen. Musculoskeletal: No definite abnormality seen involving the visualized skeleton. IMPRESSION: Moderate size sliding-type hiatal hernia. Mild right middle lobe subsegmental atelectasis. Electronically Signed: By: Lynwood Landy Raddle [name] On: 11/30/2023 15:56   Disposition Pt is being discharged home today in good condition per  MD.  Follow-up Plans & Appointments  Future Appointments  Date Time Provider Department Center  12/20/2023  9:15 AM Janene Boer, [place] [facility]  12/09/2024  3:30 PM Tysinger, Alm RAMAN, PA-C [facility]   Discharge Instructions     AMB Referral to Cardiac Rehabilitation - Phase II   Complete by: As directed    Diagnosis:  Stable Angina Coronary Stents     After initial evaluation and assessments completed: Virtual Based Care may be provided alone or in conjunction with Phase 2 Cardiac Rehab based on patient barriers.: Yes   Intensive Cardiac Rehabilitation (ICR) [facility] location only OR Traditional Cardiac Rehabilitation (TCR) *If criteria for ICR are not met will enroll in TCR [facility] only): Yes   Call MD for:  redness, tenderness, or signs of infection (pain, swelling, redness, odor or green/yellow discharge around incision site)   Complete by: As directed    Discharge instructions   Complete by: As directed    Radial Site Care Refer to this sheet in the next few weeks. These instructions provide you with information on caring for yourself after your procedure. Your caregiver may also give you more specific instructions. Your treatment has been planned according to current medical practices, but problems sometimes occur. Call your caregiver if you have any problems or questions after your procedure.  HOME CARE INSTRUCTIONS You may shower the day after the procedure. Remove the bandage (dressing) and gently wash the site with plain soap and water. Gently pat the site dry.  Do not apply powder or lotion to the site.  Do not submerge the affected site in water for 3 to 5 days.  Inspect the site at least twice daily.  Do not flex or bend the affected arm for 24 hours.  No lifting over 5 pounds (2.3 kg) for 5 days after your procedure.  Do not drive home if you are discharged the same day of the procedure. Have someone else drive you.  You may drive 24 hours after the procedure unless otherwise  instructed by your caregiver.   What to expect: Any bruising will usually fade within 1 to 2 weeks.  Blood that collects in the tissue (hematoma) may be painful to the touch. It should usually decrease in size  and tenderness within 1 to 2 weeks.   SEEK IMMEDIATE MEDICAL CARE IF: You have unusual pain at the radial site.  You have redness, warmth, swelling, or pain at the radial site.  You have drainage (other than a small amount of blood on the dressing).  You have chills.  You have a fever or persistent symptoms for more than 72 hours.  You have a fever and your symptoms suddenly get worse.  Your arm becomes pale, cool, tingly, or numb.  You have heavy bleeding from the site. Hold pressure on the site.   PLEASE DO NOT MISS ANY DOSES OF YOUR PLAVIX !!!!! Also keep a log of you blood pressures and bring back to your follow up appt. Please call the office with any questions.   Patients taking blood thinners should generally stay away from medicines like ibuprofen, Advil, Motrin, naproxen , and Aleve  due to risk of stomach bleeding. You may take Tylenol  as directed or talk to your primary doctor about alternatives.  PLEASE ENSURE THAT YOU DO NOT RUN OUT OF YOUR PLAVIX . This medication is very important to remain on for at least 6 months. IF you have issues obtaining this medication due to cost please CALL the office 3-5 business days prior to running out in order to prevent missing doses of this medication.      Discharge Medications Allergies as of 12/05/2023   No Known Allergies      Medication List     STOP taking these medications    allopurinol  100 MG tablet Commonly known as: ZYLOPRIM    metoprolol  tartrate 100 MG tablet Commonly known as: Lopressor        TAKE these medications    amLODipine  5 MG tablet Commonly known as: NORVASC  Take 1 tablet (5 mg total) by mouth daily. What changed: when to take this   apixaban  5 MG Tabs tablet Commonly known as: Eliquis  Take 1  tablet (5 mg total) by mouth 2 (two) times daily.   aspirin  EC 81 MG tablet Take 1 tablet (81 mg total) by mouth daily. Swallow whole.  STOP taking after 01/05/2024 Start taking on: December 06, 2023   brimonidine  0.15 % ophthalmic solution Commonly known as: ALPHAGAN  Place 1 drop into both eyes every evening.   clopidogrel  75 MG tablet Commonly known as: PLAVIX  Take 1 tablet (75 mg total) by mouth daily with breakfast. Start taking on: December 06, 2023   dorzolamide -timolol  2-0.5 % ophthalmic solution Commonly known as: COSOPT  Place 1 drop into both eyes in the morning.   nitroGLYCERIN  0.4 MG SL tablet Commonly known as: NITROSTAT  Place 1 tablet (0.4 mg total) under the tongue every 5 (five) minutes x 3 doses as needed for chest pain.   prednisoLONE  acetate 1 % ophthalmic suspension Commonly known as: PRED FORTE  Place 1 drop into the right eye every evening.   rosuvastatin  20 MG tablet Commonly known as: Crestor  Take 1 tablet (20 mg total) by mouth daily. What changed: when to take this   sildenafil  100 MG tablet Commonly known as: Viagra  Take 0.5-1 tablets (50-100 mg total) by mouth daily as needed for erectile dysfunction.   valsartan  80 MG tablet Commonly known as: Diovan  Take 1 tablet (80 mg total) by mouth daily.        Outstanding Labs/Studies BMP in 2 weeks   Duration of Discharge Encounter: APP Time: 25 minutes   Signed, Waddell DELENA Donath, PA-C 12/05/2023, 3:51 PM    Progress Note  Patient Name: [name] [name] Date of  Encounter: 12/05/2023 [facility] Cardiologist: Lynwood Schilling, MD   Interval Summary   Seen after cath, feeling well, no CP  Vital Signs Vitals:   12/05/23 0911 12/05/23 0916 12/05/23 0945 12/05/23 1337  BP: (!) 162/97 (!) 167/97 (!) 150/90 (!) 153/101  Pulse: 72 62 (!) 56   Resp: 14 19    Temp:    98.2 F (36.8 C)  TempSrc:    Oral  SpO2: 97% 98% 98% 98%  Weight:      Height:        Intake/Output Summary (Last  24 hours) at 12/05/2023 1556 Last data filed at 12/05/2023 0912 Gross per 24 hour  Intake --  Output 550 ml  Net -550 ml      12/05/2023    3:55 AM 12/04/2023    5:00 PM 11/29/2023    8:38 AM  Last 3 Weights  Weight (lbs) 215 lb 6.4 oz 217 lb 218 lb  Weight (kg) 97.705 kg 98.431 kg 98.884 kg      Telemetry/ECG  SR - Personally Reviewed  Physical Exam  GEN: No acute distress.   Neck: No JVD Cardiac: RRR, no murmurs, rubs, or gallops.  Respiratory: Clear to auscultation bilaterally. GI: Soft, nontender, non-distended  MS: No edema  Assessment & Plan  #CAD #Chest pain #Hypertension #Hyperlipidemia - Patient admitted for Eliquis  bridging to heparin  to facilitate cardiac catheterization. - agree with starting valsartan  80 mg daily for HTN control. -Okay to continue amlodipine  5 mg daily, and Crestor  20 mg daily - stent to proximal LAD with PTCA to diagonal - RCA 100% stenosed, medical therapy recommended. - I have instructed the patient that dual antiplatelet therapy should be taken for 1 year without interruption.  We have discussed the consequences of interrupted dual antiplatelet therapy and the risk for in-stent thrombosis.     #DVT - dx in march - patient notes he takes eliquis  daily, suspect he needs to be taking this twice daily, will reemphasize that eliquis  is twice daily.  -triple  therapy for a month, then dual therapy for 6 mo. Duration of DOAC per PCP.       For questions or updates, please contact [facility] Please consult [email or web address] for contact info under   I spent 35 minutes in the care of [name] [name] today including reviewing labs (7/22), reviewing studies (cath 7/22, CCTA 7/17, echo 5/5), face to face time discussing treatment options (15 min), and documenting in the encounter.    Signed, Soyla DELENA Merck, MD

## 2023-12-05 NOTE — Interval H&P Note (Signed)
 History and Physical Interval Note:  12/05/2023 7:48 AM  [name] [name]  has presented today for surgery, with the diagnosis of high risk coronary ct angiography.  The various methods of treatment have been discussed with the patient and family. After consideration of risks, benefits and other options for treatment, the patient has consented to  Procedure(s): LEFT HEART CATH AND CORONARY ANGIOGRAPHY (N/A)  PERCUTANEOUS CORONARY INTERVENTION  as a surgical intervention.  The patient's history has been reviewed, patient examined, no change in status, stable for surgery.  I have reviewed the patient's chart and labs.  Questions were answered to the patient's satisfaction.    Cath Lab Visit (complete for each Cath Lab visit)  Clinical Evaluation Leading to the Procedure:   ACS: No.  Non-ACS:    Anginal Classification: CCS II  Anti-ischemic medical therapy: Minimal Therapy (1 class of medications)  Non-Invasive Test Results: High-risk stress test findings: cardiac mortality >3%/year  Prior CABG: No previous CABG   [name] [name]

## 2023-12-06 LAB — LIPOPROTEIN A (LPA): Lipoprotein (a): 196 nmol/L — ABNORMAL HIGH (ref <75.0)

## 2023-12-06 NOTE — Telephone Encounter (Signed)
 CARDIAC REHAB PHASE I   Unable to reach via telephone. CRP2 referral placed for [facility]. Will mail  written educational materials to address on file.   Vaughn Asberry Hacking, RN BSN 12/06/2023 3:03 PM

## 2023-12-11 ENCOUNTER — Telehealth (HOSPITAL_COMMUNITY): Payer: Self-pay

## 2023-12-11 ENCOUNTER — Telehealth: Payer: Self-pay | Admitting: Cardiology

## 2023-12-11 NOTE — Telephone Encounter (Signed)
 FMLA Forms were received on __7/28/25___ from patient. Placed in MD's box.   Left VM for patient to bring in $29 payment in form or cash, check, or money order. We also need to fill out billing sheet and authorization form.

## 2023-12-11 NOTE — Telephone Encounter (Signed)
 Attempted to call patient in regards to Cardiac Rehab - LM on VM

## 2023-12-14 NOTE — Telephone Encounter (Signed)
 Spoke with pt and notified him that we have received his FMLA papers and that Dr. Lavona is out of the office this week but when he returns I will have him fill out the paperwork. Pt was reminded that he would need to come in to pay $29 fee and fill out billing sheet and authorization form. Pt verbalized understanding.All questions if any were answered.

## 2023-12-18 NOTE — Telephone Encounter (Signed)
 FMLA papers given to Hochrein 8/4. Paperwork completed and placed in completed FMLA forms mail box.
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
## 2023-12-20 ENCOUNTER — Ambulatory Visit: Attending: Cardiology | Admitting: Emergency Medicine

## 2023-12-20 ENCOUNTER — Encounter: Payer: Self-pay | Admitting: Physician Assistant

## 2023-12-20 VITALS — BP 132/88 | HR 56 | Ht 66.0 in | Wt 214.7 lb

## 2023-12-20 DIAGNOSIS — Z86718 Personal history of other venous thrombosis and embolism: Secondary | ICD-10-CM | POA: Diagnosis not present

## 2023-12-20 DIAGNOSIS — E785 Hyperlipidemia, unspecified: Secondary | ICD-10-CM | POA: Diagnosis not present

## 2023-12-20 DIAGNOSIS — I25119 Atherosclerotic heart disease of native coronary artery with unspecified angina pectoris: Secondary | ICD-10-CM

## 2023-12-20 DIAGNOSIS — I10 Essential (primary) hypertension: Secondary | ICD-10-CM | POA: Diagnosis not present

## 2023-12-20 DIAGNOSIS — I2510 Atherosclerotic heart disease of native coronary artery without angina pectoris: Secondary | ICD-10-CM | POA: Diagnosis not present

## 2023-12-20 DIAGNOSIS — Z79899 Other long term (current) drug therapy: Secondary | ICD-10-CM

## 2023-12-20 NOTE — Patient Instructions (Signed)
 Medication Instructions:  Your physician recommends that you continue on your current medications as directed. Please refer to the Current Medication list given to you today.  *If you need a refill on your cardiac medications before your next appointment, please call your pharmacy*  Lab Work: Fasting Lipid panel prior to next appointment   Testing/Procedures: NONE ordered at this time of appointment    Follow-Up: At [facility], you and your health needs are our priority.  As part of our continuing mission to provide you with exceptional heart care, our providers are all part of one team.  This team includes your primary Cardiologist (physician) and Advanced Practice Providers or APPs (Physician Assistants and Nurse Practitioners) who all work together to provide you with the care you need, when you need it.  Your next appointment:   4 month(s)  Provider:   Lynwood Schilling, MD    We recommend signing up for the patient portal called MyChart.  Sign up information is provided on this After Visit Summary.  MyChart is used to connect with patients for Virtual Visits (Telemedicine).  Patients are able to view lab/test results, encounter notes, upcoming appointments, etc.  Non-urgent messages can be sent to your provider as well.   To learn more about what you can do with MyChart, go to [email or web address].   Other Instructions Monitor blood pressure.

## 2023-12-21 ENCOUNTER — Telehealth: Payer: Self-pay | Admitting: Cardiology

## 2023-12-21 DIAGNOSIS — Z0279 Encounter for issue of other medical certificate: Secondary | ICD-10-CM

## 2023-12-21 NOTE — Telephone Encounter (Signed)
 I received an additional form to be completed.  It is an Attending Physician Statement from [employer].  Patient came in today and signed the release of information and paid the $29 form fee. Form in Dr. Denver box.

## 2023-12-22 NOTE — Telephone Encounter (Signed)
 Paperwork received and given to Dr. Lavona to review and fill out on 8/8

## 2023-12-23 LAB — COLOGUARD: COLOGUARD: NEGATIVE

## 2023-12-25 ENCOUNTER — Ambulatory Visit: Payer: Self-pay | Admitting: Medical

## 2023-12-25 NOTE — Progress Notes (Signed)
 Results thru my chart

## 2023-12-25 NOTE — Telephone Encounter (Signed)
 Paperwork placed in mailbox for completed FMLA paperwork

## 2023-12-26 NOTE — Telephone Encounter (Signed)
 Completed FMLA form sent to [employer] and scanned into chart.

## 2023-12-27 ENCOUNTER — Ambulatory Visit: Admitting: Physician Assistant

## 2023-12-27 NOTE — Telephone Encounter (Signed)
 Patient called back to say that they havent received the fax. Stated that you need to enter the phone number in without the 1 in front. Patient would like a phone call back once its done. Please advise

## 2023-12-28 ENCOUNTER — Telehealth (HOSPITAL_COMMUNITY): Payer: Self-pay

## 2023-12-28 NOTE — Telephone Encounter (Signed)
 Pt requesting a c/b in regards to his FMLA paperwork

## 2023-12-28 NOTE — Telephone Encounter (Signed)
 Called patient regarding cardiac rehab, went over program. Patient states he may be interested in the future but not at this time. Informed him he can call us  to reopen his referral when he is ready.  Closing referral.

## 2024-01-03 ENCOUNTER — Telehealth: Payer: Self-pay | Admitting: Cardiology

## 2024-01-03 MED ORDER — VALSARTAN 80 MG PO TABS: 80.0000 mg | ORAL_TABLET | Freq: Every day | ORAL | 3 refills | Status: AC

## 2024-01-03 MED ORDER — CLOPIDOGREL BISULFATE 75 MG PO TABS: 75.0000 mg | ORAL_TABLET | Freq: Every day | ORAL | 1 refills | Status: AC

## 2024-01-03 NOTE — Telephone Encounter (Signed)
*  STAT* If patient is at the pharmacy, call can be transferred to refill team.   1. Which medications need to be refilled? (please list name of each medication and dose if known) clopidogrel  (PLAVIX ) 75 MG tablet  valsartan  (DIOVAN ) 80 MG tablet    2. Would you like to learn more about the convenience, safety, & potential cost savings by using the [facility]?     3. Are you open to using the [facility] (Type [facility].  ).   4. Which pharmacy/location (including street and city if local pharmacy) is medication to be sent to? [facility] - [place] ([place]), [place] - [address]    5. Do they need a 30 day or 90 day supply? 30 day    Pt out of medication

## 2024-01-03 NOTE — Telephone Encounter (Signed)
 RX sent in

## 2024-01-03 NOTE — Telephone Encounter (Signed)
 Attending Physician's Statement faxed to [employer] and scanned into chart.

## 2024-04-12 ENCOUNTER — Other Ambulatory Visit (HOSPITAL_COMMUNITY): Payer: Self-pay

## 2024-04-18 NOTE — Progress Notes (Signed)
  Cardiology Office Note:   Date:  04/19/2024  ID:  [name] [name], DOB 21-Sep-1965, MRN [id number] PCP: [name]  [facility] Providers Cardiologist:  Lynwood Schilling, MD {  History of Present Illness:   [name] [name] is a 58 y.o. 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   He had a normal echo but very high calcium  score and eventually had a cath with an occluded RCA and high grade stenosis in the prox LAD.  He also had high grade stenosis in a diagonal branch.  He had an PCI without stenting of the D1 with residual ostial stenosis that will be managed medically.    Since he was seen he has done very well.  The patient denies any new symptoms such as chest discomfort, neck or arm discomfort. There has been no new shortness of breath, PND or orthopnea. There have been no reported palpitations, presyncope or syncope.   He works at [employer]  ROS: As stated in the HPI and negative for all other systems.  Studies Reviewed:    EKG:     NA  Risk Assessment/Calculations:              Physical Exam:   VS:  BP 110/84 (BP Location: Left Arm, Patient Position: Sitting, Cuff Size: Large)   Pulse 62   Ht 5' 6 (1.676 m)   Wt 210 lb (95.3 kg)   SpO2 98%   BMI 33.89 kg/m    Wt Readings from Last 3 Encounters:  04/19/24 210 lb (95.3 kg)  12/20/23 214 lb 11.2 oz (97.4 kg)  12/05/23 215 lb 6.4 oz (97.7 kg)     GEN: Well nourished, well developed in no acute distress NECK: No JVD; No carotid bruits CARDIAC: RRR, no murmurs, rubs, gallops RESPIRATORY:  Clear to auscultation without rales, wheezing or rhonchi  ABDOMEN: Soft, non-tender, non-distended EXTREMITIES:  No edema; No deformity   ASSESSMENT AND PLAN:   HTN: His blood pressure is at target.  No change in therapy.    Sleep apnea:   I am still try to get him in for a split night sleep study.  He has a STOP-BANG of 6 and already  documented sleep apnea but does not have CPAP.    CAD: We reviewed his anatomy.  No change in therapy.  He will continue with risk reduction.  He has had no new symptoms.    Dyslipidemia: LDL in July last year was 862.  Goal LDL is in the 50s.  I am going to check a lipid profile today.  Of note he did have an elevated LP(a).  He will manage this when the therapy becomes available.    DVT: He had this when he was being managed with a gout flare.  It is probably provoked and when we see him back in July I am most likely going to discontinue DOAC and just continue with Plavix  alone.  Follow up in July.  Signed, Lynwood Schilling, MD

## 2024-04-19 ENCOUNTER — Encounter: Payer: Self-pay | Admitting: Cardiology

## 2024-04-19 ENCOUNTER — Ambulatory Visit: Attending: Cardiology | Admitting: Cardiology

## 2024-04-19 VITALS — BP 110/84 | HR 62 | Ht 66.0 in | Wt 210.0 lb

## 2024-04-19 DIAGNOSIS — G4730 Sleep apnea, unspecified: Secondary | ICD-10-CM

## 2024-04-19 DIAGNOSIS — I10 Essential (primary) hypertension: Secondary | ICD-10-CM

## 2024-04-19 DIAGNOSIS — E785 Hyperlipidemia, unspecified: Secondary | ICD-10-CM | POA: Diagnosis not present

## 2024-04-19 DIAGNOSIS — I2510 Atherosclerotic heart disease of native coronary artery without angina pectoris: Secondary | ICD-10-CM

## 2024-04-19 NOTE — Patient Instructions (Signed)
 Medication Instructions:  Your physician recommends that you continue on your current medications as directed. Please refer to the Current Medication list given to you today.  *If you need a refill on your cardiac medications before your next appointment, please call your pharmacy*  Lab Work: Fasting lipid panel today at [facility] If you have labs (blood work) drawn today and your tests are completely normal, you will receive your results only by: MyChart Message (if you have MyChart) OR A paper copy in the mail If you have any lab test that is abnormal or we need to change your treatment, we will call you to review the results.  Testing/Procedures: NONE  Follow-Up: At [facility], you and your health needs are our priority.  As part of our continuing mission to provide you with exceptional heart care, our providers are all part of one team.  This team includes your primary Cardiologist (physician) and Advanced Practice Providers or APPs (Physician Assistants and Nurse Practitioners) who all work together to provide you with the care you need, when you need it.  Your next appointment:   July 2026  Provider:   Lynwood Schilling, MD    We recommend signing up for the patient portal called MyChart.  Sign up information is provided on this After Visit Summary.  MyChart is used to connect with patients for Virtual Visits (Telemedicine).  Patients are able to view lab/test results, encounter notes, upcoming appointments, etc.  Non-urgent messages can be sent to your provider as well.   To learn more about what you can do with MyChart, go to [email or web address].

## 2024-04-20 LAB — LIPID PANEL
Chol/HDL Ratio: 2.2 ratio (ref 0.0–5.0)
Cholesterol, Total: 119 mg/dL (ref 100–199)
HDL: 54 mg/dL (ref >39)
LDL Chol Calc (NIH): 54 mg/dL (ref 0–99)
Triglycerides: 45 mg/dL (ref 0–149)
VLDL Cholesterol Cal: 11 mg/dL (ref 5–40)

## 2024-04-21 ENCOUNTER — Ambulatory Visit: Payer: Self-pay | Admitting: Cardiology

## 2024-04-22 ENCOUNTER — Telehealth: Payer: Self-pay

## 2024-04-22 NOTE — Telephone Encounter (Signed)
**Note De-Identified Taleisha Kaczynski Obfuscation** I started a Split Night Sleep Study through the [facility] Provider Portal and it is currently pending. Tracking #: [id number]

## 2024-06-06 ENCOUNTER — Other Ambulatory Visit: Payer: Self-pay | Admitting: Medical

## 2024-12-09 ENCOUNTER — Encounter: Payer: Self-pay | Admitting: Medical
